# Patient Record
Sex: Female | Born: 1960 | Race: White | Hispanic: No | Marital: Married | Smoking: Former smoker
Health system: Southern US, Community
[De-identification: ages and names within clinical notes are randomized; demographics above are authoritative.]

## PROBLEM LIST (undated history)

## (undated) DIAGNOSIS — Z8719 Personal history of other diseases of the digestive system: Secondary | ICD-10-CM

## (undated) DIAGNOSIS — F329 Major depressive disorder, single episode, unspecified: Secondary | ICD-10-CM

## (undated) DIAGNOSIS — F32A Depression, unspecified: Secondary | ICD-10-CM

## (undated) DIAGNOSIS — I10 Essential (primary) hypertension: Secondary | ICD-10-CM

## (undated) DIAGNOSIS — Z8711 Personal history of peptic ulcer disease: Secondary | ICD-10-CM

## (undated) DIAGNOSIS — R197 Diarrhea, unspecified: Secondary | ICD-10-CM

## (undated) DIAGNOSIS — D249 Benign neoplasm of unspecified breast: Secondary | ICD-10-CM

## (undated) DIAGNOSIS — K625 Hemorrhage of anus and rectum: Secondary | ICD-10-CM

## (undated) DIAGNOSIS — E119 Type 2 diabetes mellitus without complications: Secondary | ICD-10-CM

## (undated) HISTORY — DX: Essential (primary) hypertension: I10

## (undated) HISTORY — DX: Personal history of other diseases of the digestive system: Z87.19

## (undated) HISTORY — PX: ESOPHAGOGASTRODUODENOSCOPY: SHX1529

## (undated) HISTORY — PX: ABDOMINAL HYSTERECTOMY: SHX81

## (undated) HISTORY — PX: BREAST SURGERY: SHX581

## (undated) HISTORY — PX: BREAST EXCISIONAL BIOPSY: SUR124

## (undated) HISTORY — PX: AUGMENTATION MAMMAPLASTY: SUR837

## (undated) HISTORY — PX: BREAST ENHANCEMENT SURGERY: SHX7

## (undated) HISTORY — DX: Hemorrhage of anus and rectum: K62.5

## (undated) HISTORY — DX: Diarrhea, unspecified: R19.7

## (undated) HISTORY — DX: Personal history of peptic ulcer disease: Z87.11

---

## 2006-05-26 ENCOUNTER — Emergency Department (HOSPITAL_COMMUNITY): Admission: EM | Admit: 2006-05-26 | Discharge: 2006-05-26 | Payer: Self-pay | Admitting: Emergency Medicine

## 2008-05-25 ENCOUNTER — Encounter: Admission: RE | Admit: 2008-05-25 | Discharge: 2008-05-25 | Payer: Self-pay | Admitting: Obstetrics and Gynecology

## 2008-05-27 ENCOUNTER — Encounter: Admission: RE | Admit: 2008-05-27 | Discharge: 2008-05-27 | Payer: Self-pay | Admitting: Obstetrics and Gynecology

## 2009-12-02 ENCOUNTER — Encounter: Payer: Self-pay | Admitting: Emergency Medicine

## 2009-12-02 ENCOUNTER — Ambulatory Visit: Payer: Self-pay | Admitting: Diagnostic Radiology

## 2010-01-09 ENCOUNTER — Encounter: Admission: RE | Admit: 2010-01-09 | Discharge: 2010-01-09 | Payer: Self-pay | Admitting: Family Medicine

## 2010-07-08 ENCOUNTER — Emergency Department (HOSPITAL_COMMUNITY): Admission: EM | Admit: 2010-07-08 | Discharge: 2010-07-08 | Payer: Self-pay | Admitting: Emergency Medicine

## 2010-07-12 ENCOUNTER — Emergency Department (HOSPITAL_COMMUNITY): Admission: EM | Admit: 2010-07-12 | Discharge: 2010-07-12 | Payer: Self-pay | Admitting: Emergency Medicine

## 2010-10-19 ENCOUNTER — Inpatient Hospital Stay (HOSPITAL_COMMUNITY): Admission: EM | Admit: 2010-10-19 | Discharge: 2009-12-04 | Payer: Self-pay | Admitting: Internal Medicine

## 2011-01-22 ENCOUNTER — Other Ambulatory Visit: Payer: Self-pay | Admitting: Family Medicine

## 2011-01-22 DIAGNOSIS — Z1231 Encounter for screening mammogram for malignant neoplasm of breast: Secondary | ICD-10-CM

## 2011-01-26 LAB — POCT I-STAT, CHEM 8
BUN: 5 mg/dL — ABNORMAL LOW (ref 6–23)
Calcium, Ion: 1.03 mmol/L — ABNORMAL LOW (ref 1.12–1.32)
Chloride: 105 mEq/L (ref 96–112)
Creatinine, Ser: 0.8 mg/dL (ref 0.4–1.2)
Glucose, Bld: 90 mg/dL (ref 70–99)
HCT: 46 % (ref 36.0–46.0)
Hemoglobin: 15.6 g/dL — ABNORMAL HIGH (ref 12.0–15.0)
Potassium: 4 mEq/L (ref 3.5–5.1)
Sodium: 138 mEq/L (ref 135–145)
TCO2: 23 mmol/L (ref 0–100)

## 2011-01-26 LAB — DIFFERENTIAL
Basophils Absolute: 0 10*3/uL (ref 0.0–0.1)
Basophils Relative: 0 % (ref 0–1)
Eosinophils Absolute: 0.2 10*3/uL (ref 0.0–0.7)
Eosinophils Relative: 4 % (ref 0–5)
Lymphocytes Relative: 43 % (ref 12–46)
Lymphs Abs: 2 10*3/uL (ref 0.7–4.0)
Monocytes Absolute: 0.4 10*3/uL (ref 0.1–1.0)
Monocytes Relative: 10 % (ref 3–12)
Neutro Abs: 2 10*3/uL (ref 1.7–7.7)
Neutrophils Relative %: 43 % (ref 43–77)

## 2011-01-26 LAB — CBC
HCT: 42.1 % (ref 36.0–46.0)
Hemoglobin: 15.2 g/dL — ABNORMAL HIGH (ref 12.0–15.0)
MCH: 34.3 pg — ABNORMAL HIGH (ref 26.0–34.0)
MCHC: 36.1 g/dL — ABNORMAL HIGH (ref 30.0–36.0)
MCV: 95 fL (ref 78.0–100.0)
Platelets: 164 10*3/uL (ref 150–400)
RBC: 4.43 MIL/uL (ref 3.87–5.11)
RDW: 12.1 % (ref 11.5–15.5)
WBC: 4.6 10*3/uL (ref 4.0–10.5)

## 2011-01-26 LAB — POCT CARDIAC MARKERS
CKMB, poc: <1 ng/mL — ABNORMAL LOW (ref 1.0–8.0)
Myoglobin, poc: 38.8 ng/mL (ref 12–200)
Troponin i, poc: <0.05 ng/mL (ref 0.00–0.09)

## 2011-01-29 ENCOUNTER — Ambulatory Visit
Admission: RE | Admit: 2011-01-29 | Discharge: 2011-01-29 | Disposition: A | Discharge status: Home / Self Care | Payer: Managed Care, Other (non HMO) | Source: Ambulatory Visit | Attending: Family Medicine | Admitting: Family Medicine

## 2011-01-29 DIAGNOSIS — Z1231 Encounter for screening mammogram for malignant neoplasm of breast: Secondary | ICD-10-CM

## 2011-01-29 LAB — COMPREHENSIVE METABOLIC PANEL
ALT: 18 U/L (ref 0–35)
ALT: 21 U/L (ref 0–35)
AST: 21 U/L (ref 0–37)
AST: 36 U/L (ref 0–37)
Albumin: 3.5 g/dL (ref 3.5–5.2)
Albumin: 4.4 g/dL (ref 3.5–5.2)
Alkaline Phosphatase: 69 U/L (ref 39–117)
Alkaline Phosphatase: 70 U/L (ref 39–117)
BUN: 12 mg/dL (ref 6–23)
BUN: 21 mg/dL (ref 6–23)
CO2: 25 mEq/L (ref 19–32)
CO2: 27 mEq/L (ref 19–32)
Calcium: 9.1 mg/dL (ref 8.4–10.5)
Calcium: 9.3 mg/dL (ref 8.4–10.5)
Chloride: 103 mEq/L (ref 96–112)
Chloride: 106 mEq/L (ref 96–112)
Creatinine, Ser: 0.74 mg/dL (ref 0.4–1.2)
Creatinine, Ser: 0.8 mg/dL (ref 0.4–1.2)
GFR calc Af Amer: >60 mL/min (ref >60)
GFR calc Af Amer: >60 mL/min (ref >60)
GFR calc non Af Amer: >60 mL/min (ref >60)
GFR calc non Af Amer: >60 mL/min (ref >60)
Glucose, Bld: 167 mg/dL — ABNORMAL HIGH (ref 70–99)
Glucose, Bld: 94 mg/dL (ref 70–99)
Potassium: 3.6 mEq/L (ref 3.5–5.1)
Potassium: 4.6 mEq/L (ref 3.5–5.1)
Sodium: 137 mEq/L (ref 135–145)
Sodium: 139 mEq/L (ref 135–145)
Total Bilirubin: 0.6 mg/dL (ref 0.3–1.2)
Total Bilirubin: 0.9 mg/dL (ref 0.3–1.2)
Total Protein: 6.2 g/dL (ref 6.0–8.3)
Total Protein: 7.3 g/dL (ref 6.0–8.3)

## 2011-01-29 LAB — PROTIME-INR
INR: 0.92 (ref 0.00–1.49)
Prothrombin Time: 12.3 seconds (ref 11.6–15.2)

## 2011-01-29 LAB — APTT: aPTT: 28 seconds (ref 24–37)

## 2011-01-29 LAB — CBC
HCT: 39.3 % (ref 36.0–46.0)
HCT: 42.2 % (ref 36.0–46.0)
Hemoglobin: 14.1 g/dL (ref 12.0–15.0)
Hemoglobin: 14.8 g/dL (ref 12.0–15.0)
MCHC: 35 g/dL (ref 30.0–36.0)
MCHC: 35.9 g/dL (ref 30.0–36.0)
MCV: 96 fL (ref 78.0–100.0)
MCV: 96.6 fL (ref 78.0–100.0)
Platelets: 136 10*3/uL — ABNORMAL LOW (ref 150–400)
Platelets: 176 10*3/uL (ref 150–400)
RBC: 4.07 MIL/uL (ref 3.87–5.11)
RBC: 4.4 MIL/uL (ref 3.87–5.11)
RDW: 11.3 % — ABNORMAL LOW (ref 11.5–15.5)
RDW: 12.1 % (ref 11.5–15.5)
WBC: 5.4 10*3/uL (ref 4.0–10.5)
WBC: 5.8 10*3/uL (ref 4.0–10.5)

## 2011-01-29 LAB — LIPID PANEL
Cholesterol: 204 mg/dL — ABNORMAL HIGH (ref 0–200)
HDL: 66 mg/dL (ref >39)
LDL Cholesterol: 109 mg/dL — ABNORMAL HIGH (ref 0–99)
Total CHOL/HDL Ratio: 3.1 RATIO
Triglycerides: 147 mg/dL (ref <150)
VLDL: 29 mg/dL (ref 0–40)

## 2011-01-29 LAB — BASIC METABOLIC PANEL
BUN: 14 mg/dL (ref 6–23)
CO2: 31 mEq/L (ref 19–32)
Calcium: 9.9 mg/dL (ref 8.4–10.5)
Chloride: 102 mEq/L (ref 96–112)
Creatinine, Ser: 0.85 mg/dL (ref 0.4–1.2)
GFR calc Af Amer: >60 mL/min (ref >60)
GFR calc non Af Amer: >60 mL/min (ref >60)
Glucose, Bld: 93 mg/dL (ref 70–99)
Potassium: 4.5 mEq/L (ref 3.5–5.1)
Sodium: 140 mEq/L (ref 135–145)

## 2011-01-29 LAB — DIFFERENTIAL
Basophils Absolute: 0.1 10*3/uL (ref 0.0–0.1)
Basophils Relative: 2 % — ABNORMAL HIGH (ref 0–1)
Eosinophils Absolute: 0.1 10*3/uL (ref 0.0–0.7)
Eosinophils Relative: 2 % (ref 0–5)
Lymphocytes Relative: 38 % (ref 12–46)
Lymphs Abs: 2.2 10*3/uL (ref 0.7–4.0)
Monocytes Absolute: 0.6 10*3/uL (ref 0.1–1.0)
Monocytes Relative: 10 % (ref 3–12)
Neutro Abs: 2.8 10*3/uL (ref 1.7–7.7)
Neutrophils Relative %: 48 % (ref 43–77)

## 2011-01-29 LAB — CARDIAC PANEL(CRET KIN+CKTOT+MB+TROPI)
CK, MB: 1.1 ng/mL (ref 0.3–4.0)
CK, MB: 1.1 ng/mL (ref 0.3–4.0)
CK, MB: 1.1 ng/mL (ref 0.3–4.0)
Relative Index: INVALID (ref 0.0–2.5)
Relative Index: INVALID (ref 0.0–2.5)
Relative Index: INVALID (ref 0.0–2.5)
Total CK: 67 U/L (ref 7–177)
Total CK: 72 U/L (ref 7–177)
Total CK: 90 U/L (ref 7–177)
Troponin I: 0.01 ng/mL (ref 0.00–0.06)
Troponin I: 0.02 ng/mL (ref 0.00–0.06)
Troponin I: 0.04 ng/mL (ref 0.00–0.06)

## 2011-01-29 LAB — POCT CARDIAC MARKERS
CKMB, poc: <1 ng/mL — ABNORMAL LOW (ref 1.0–8.0)
Myoglobin, poc: 35.7 ng/mL (ref 12–200)
Troponin i, poc: <0.05 ng/mL (ref 0.00–0.09)

## 2011-01-29 LAB — TSH: TSH: 4.195 u[IU]/mL (ref 0.350–4.500)

## 2011-01-29 LAB — HEMOGLOBIN A1C
Hgb A1c MFr Bld: 5.8 % (ref 4.6–6.1)
Mean Plasma Glucose: 120 mg/dL

## 2012-01-07 ENCOUNTER — Other Ambulatory Visit: Payer: Self-pay | Admitting: Family Medicine

## 2012-01-07 DIAGNOSIS — Z1231 Encounter for screening mammogram for malignant neoplasm of breast: Secondary | ICD-10-CM

## 2012-01-30 ENCOUNTER — Ambulatory Visit
Admission: RE | Admit: 2012-01-30 | Discharge: 2012-01-30 | Disposition: A | Discharge status: Home / Self Care | Payer: Managed Care, Other (non HMO) | Source: Ambulatory Visit | Attending: Family Medicine | Admitting: Family Medicine

## 2012-01-30 DIAGNOSIS — Z1231 Encounter for screening mammogram for malignant neoplasm of breast: Secondary | ICD-10-CM

## 2012-12-13 HISTORY — PX: COLONOSCOPY: SHX174

## 2012-12-24 ENCOUNTER — Other Ambulatory Visit: Payer: Self-pay | Admitting: Family Medicine

## 2012-12-24 DIAGNOSIS — Z1231 Encounter for screening mammogram for malignant neoplasm of breast: Secondary | ICD-10-CM

## 2013-09-13 ENCOUNTER — Encounter (HOSPITAL_COMMUNITY): Payer: Self-pay | Admitting: Emergency Medicine

## 2013-09-13 ENCOUNTER — Emergency Department (HOSPITAL_COMMUNITY): Payer: Managed Care, Other (non HMO)

## 2013-09-13 ENCOUNTER — Emergency Department (HOSPITAL_COMMUNITY)
Admission: EM | Admit: 2013-09-13 | Discharge: 2013-09-13 | Disposition: A | Discharge status: Home / Self Care | Payer: Managed Care, Other (non HMO) | Attending: Emergency Medicine | Admitting: Emergency Medicine

## 2013-09-13 DIAGNOSIS — I10 Essential (primary) hypertension: Secondary | ICD-10-CM | POA: Insufficient documentation

## 2013-09-13 DIAGNOSIS — R002 Palpitations: Secondary | ICD-10-CM

## 2013-09-13 DIAGNOSIS — R61 Generalized hyperhidrosis: Secondary | ICD-10-CM | POA: Insufficient documentation

## 2013-09-13 DIAGNOSIS — M6281 Muscle weakness (generalized): Secondary | ICD-10-CM | POA: Insufficient documentation

## 2013-09-13 DIAGNOSIS — F17200 Nicotine dependence, unspecified, uncomplicated: Secondary | ICD-10-CM | POA: Insufficient documentation

## 2013-09-13 DIAGNOSIS — Z888 Allergy status to other drugs, medicaments and biological substances status: Secondary | ICD-10-CM | POA: Insufficient documentation

## 2013-09-13 DIAGNOSIS — Z79899 Other long term (current) drug therapy: Secondary | ICD-10-CM | POA: Insufficient documentation

## 2013-09-13 LAB — CBC
HCT: 44.1 % (ref 36.0–46.0)
Hemoglobin: 16.1 g/dL — ABNORMAL HIGH (ref 12.0–15.0)
MCH: 35.4 pg — ABNORMAL HIGH (ref 26.0–34.0)
MCHC: 36.5 g/dL — ABNORMAL HIGH (ref 30.0–36.0)
MCV: 96.9 fL (ref 78.0–100.0)
Platelets: 197 10*3/uL (ref 150–400)
RBC: 4.55 MIL/uL (ref 3.87–5.11)
RDW: 11.8 % (ref 11.5–15.5)
WBC: 4.7 10*3/uL (ref 4.0–10.5)

## 2013-09-13 LAB — BASIC METABOLIC PANEL
BUN: 13 mg/dL (ref 6–23)
CO2: 24 mEq/L (ref 19–32)
Calcium: 9.8 mg/dL (ref 8.4–10.5)
Chloride: 99 mEq/L (ref 96–112)
Creatinine, Ser: 0.72 mg/dL (ref 0.50–1.10)
GFR calc Af Amer: >90 mL/min (ref >90)
GFR calc non Af Amer: >90 mL/min (ref >90)
Glucose, Bld: 95 mg/dL (ref 70–99)
Potassium: 3.4 mEq/L — ABNORMAL LOW (ref 3.5–5.1)
Sodium: 137 mEq/L (ref 135–145)

## 2013-09-13 LAB — POCT I-STAT TROPONIN I: Troponin i, poc: 0 ng/mL (ref 0.00–0.08)

## 2013-09-13 MED ORDER — HYDROCHLOROTHIAZIDE 25 MG PO TABS: 25.0000 mg | ORAL_TABLET | Freq: Every day | ORAL | Status: DC

## 2013-09-13 MED ORDER — PANTOPRAZOLE SODIUM 40 MG PO TBEC
40.0000 mg | DELAYED_RELEASE_TABLET | Freq: Every day | ORAL | Status: DC
  Filled 2013-09-13: qty 1

## 2013-09-13 MED ORDER — LOSARTAN POTASSIUM-HCTZ 100-25 MG PO TABS: 1.0000 | ORAL_TABLET | Freq: Every day | ORAL | Status: DC

## 2013-09-13 MED ORDER — ASPIRIN 81 MG PO CHEW
324.0000 mg | CHEWABLE_TABLET | Freq: Once | ORAL | Status: DC
Start: 2013-09-13 — End: 2013-09-13

## 2013-09-13 MED ORDER — LOSARTAN POTASSIUM 50 MG PO TABS
100.0000 mg | ORAL_TABLET | Freq: Every day | ORAL | Status: DC
  Filled 2013-09-13: qty 2

## 2013-09-13 NOTE — ED Notes (Signed)
Pt returned from xray and states she was ready to go home. PA informed. Pt refused all ordered medications.

## 2013-09-13 NOTE — ED Provider Notes (Signed)
CSN: [id number]     Arrival date & time 09/13/13  7829 History   First MD Initiated Contact with Patient 09/13/13 [phone]     Chief Complaint  Patient presents with  . Chest Pain   (Consider location/radiation/quality/duration/timing/severity/associated sxs/prior Treatment) HPI  Laurie Larson is a 52 y.o. female with past medical history significant for hypertension and tobacco use disorder complaining of palpitation onset at 4:30 AM today. Heart rate was taken by her husband who is an EMT and was found to be around 200. He also took her blood pressure which was elevated in the 140s over 120s. Palpitations lasted for approximately 2 hours and there was an associated dull ache in the mid chest that lasted for approximately 5 minutes. Patient denies any shortness of breath and nausea but endorses a generalized weakness and diaphoresis. Patient had a similar episode approximately 3 weeks ago work. Patient denies any cocaine, methamphetamine, caffeine or energy drinks use. Patient also denies cough, fever, history of early cardiac death, recent immobilizations, calf pain or lower extremity swelling.  History reviewed. No pertinent past medical history. History reviewed. No pertinent past surgical history. Family History  Problem Relation Age of Onset  . Cancer Mother    History  Substance Use Topics  . Smoking status: Current Every Day Smoker  . Smokeless tobacco: Never Used  . Alcohol Use: Yes   OB History   Grav Para Term Preterm Abortions TAB SAB Ect Mult Living                 Review of Systems 10 systems reviewed and found to be negative, except as noted in the HPI  Allergies  Acetaminophen  Home Medications   Current Outpatient Rx  Name  Route  Sig  Dispense  Refill  . losartan-hydrochlorothiazide (HYZAAR) 100-25 MG per tablet   Oral   Take 1 tablet by mouth daily.         . Multiple Vitamin (MULTIVITAMIN WITH MINERALS) TABS tablet   Oral   Take 1 tablet by mouth  daily.         Marland Kitchen omeprazole (PRILOSEC) 20 MG capsule   Oral   Take 20 mg by mouth daily.          BP 176/107  Pulse 82  Temp(Src) 98.6 F (37 C) (Oral)  Resp 13  SpO2 99% Physical Exam  Nursing note and vitals reviewed. Constitutional: She is oriented to person, place, and time. She appears well-developed and well-nourished. No distress.  HENT:  Head: Normocephalic.  Eyes: Conjunctivae and EOM are normal. Pupils are equal, round, and reactive to light.  Neck: Normal range of motion. No JVD present.  Cardiovascular: Normal rate, regular rhythm and intact distal pulses.   Pulmonary/Chest: Effort normal and breath sounds normal. No stridor. No respiratory distress. She has no wheezes. She has no rales. She exhibits no tenderness.  Abdominal: Soft. Bowel sounds are normal. She exhibits no distension and no mass. There is no tenderness. There is no rebound and no guarding.  Musculoskeletal: Normal range of motion.  No calf asymmetry, superficial collaterals, palpable cords, edema, Homans sign negative bilaterally.    Neurological: She is alert and oriented to person, place, and time.  Follows commands, Goal oriented speech, Strength is 5 out of 5x4 extremities, patient ambulates with a coordinated in nonantalgic gait. Sensation is grossly intact.   Psychiatric: She has a normal mood and affect.    ED Course  Procedures (including critical care time) Labs Review Labs  Reviewed  CBC - Abnormal; Notable for the following:    Hemoglobin 16.1 (*)    MCH 35.4 (*)    MCHC 36.5 (*)    All other components within normal limits  BASIC METABOLIC PANEL - Abnormal; Notable for the following:    Potassium 3.4 (*)    All other components within normal limits  POCT I-STAT TROPONIN I   Imaging Review Dg Chest 2 View  09/13/2013   CLINICAL DATA:  Chest pain  EXAM: CHEST  2 VIEW  COMPARISON:  07/12/2010  FINDINGS: The heart size and mediastinal contours are within normal limits. Both lungs  are clear of edema or consolidation. No effusion or pneumothorax. The visualized skeletal structures are unremarkable. Bilateral breast implant.  IMPRESSION: No evidence of active cardiopulmonary disease.   Electronically Signed   By: Tiburcio Pea M.D.   On: 09/13/2013 06:43    EKG Interpretation   None      Date: 09/13/2013  Rate: 92  Rhythm: normal sinus rhythm  QRS Axis: normal  Intervals: normal  ST/T Wave abnormalities: normal  Conduction Disutrbances:none  Narrative Interpretation: P-mitrale  Old EKG Reviewed: unchanged  7:00 AM patient would like to go home and would not like to stay for a delta troponin. I have tried to convince her and her husband to stay but they have opted to leave. I have advised her to call 911 if the palpitations or chest pain returns. I have advised her to follow with her primary care and cardiology as soon as possible to discuss Holter monitoring. Smoking cessation and decreased sodium intake encourage. I have also advised her to start a daily low dose aspirin to prevent complications for possible paroxysmal atrial fibrillation.   MDM   1. Palpitations      Filed Vitals:   09/13/13 0527 09/13/13 0600 09/13/13 0615  BP: 158/121 156/109 176/107  Pulse: 90 81 82  Temp: 98.6 F (37 C)    TempSrc: Oral    Resp: 16 14 13   SpO2: 97% 97% 99%     Trameka Larson is a 52 y.o. female with 2 hours of palpitations this a.m. EKG shows a normal sinus rhythm, patient has hypertension moderately well-controlled with Hyzaar. EKG does show a P mitrale. No LVH. This is likely a proximal A. fib from long-term hypertension and left atrial enlargement. I have asked the patient to stay for cardiac monitoring anda delta troponin. We have discussed the importance of following up for Holter monitoring. Patient has declined to stay after initial evaluation. She seems reliable for followup, her husband is an EMT. I have advised her that she should call 911 immediately  if the palpitations return. Counseled on smoking cessation, sodium reduction. The pastor to start a daily aspirin.  Medications  pantoprazole (PROTONIX) EC tablet 40 mg (40 mg Oral Not Given 09/13/13 0652)  losartan (COZAAR) tablet 100 mg (100 mg Oral Not Given 09/13/13 0652)  hydrochlorothiazide (HYDRODIURIL) tablet 25 mg (25 mg Oral Not Given 09/13/13 0653)    Pt is hemodynamically stable, appropriate for, and amenable to discharge at this time. Pt verbalized understanding and agrees with care plan. All questions answered. Outpatient follow-up and specific return precautions discussed.    Note: Portions of this report may have been transcribed using voice recognition software. Every effort was made to ensure accuracy; however, inadvertent computerized transcription errors may be present     Wynetta Emery, PA-C 09/13/13 0703

## 2013-09-13 NOTE — ED Notes (Signed)
Pt reports that she woke up at 0400 this morning and felt like her heart was racing. Pt states she felt SOB, was sweaty, and felt weak. Pt states the pain decreased while she was en route to the department. Pt states her husband tried to take her HR at home but it was too fast to count. Pt reports she has not taken her BP medication today yet, pt states she has not missed a dose of her BP medication. Pt reports she has not had HTN in awhile like this (152/112). Pt denies a HA, N/V, or chest pain at this time. PT states she took 324 mg of aspirin before coming to the department.

## 2013-09-13 NOTE — ED Notes (Signed)
Pt states she woke up tonight with heart racing, high blood pressure, and left side chest tightness, with weakness, diaphoresis. Pain exacerbated by nothing and relived by nothing. Denies nausea.

## 2013-09-13 NOTE — ED Notes (Signed)
Patient transported to X-ray 

## 2013-09-13 NOTE — ED Notes (Signed)
Orders discontinued as pt already had 324 mg of aspirin prior to arrival to department.

## 2013-09-13 NOTE — ED Provider Notes (Signed)
Medical screening examination/treatment/procedure(s) were performed by non-physician practitioner and as supervising physician I was immediately available for consultation/collaboration.  EKG: nsr, no acute ischemic changes, normal intervals, normal axis, normal qrs complex   Brandt Loosen, MD 09/13/13 [phone]

## 2014-04-23 ENCOUNTER — Other Ambulatory Visit: Payer: Self-pay

## 2014-04-23 DIAGNOSIS — Z1231 Encounter for screening mammogram for malignant neoplasm of breast: Secondary | ICD-10-CM

## 2014-06-02 ENCOUNTER — Inpatient Hospital Stay: Admission: RE | Admit: 2014-06-02 | Payer: Managed Care, Other (non HMO) | Source: Ambulatory Visit

## 2014-06-09 ENCOUNTER — Encounter (INDEPENDENT_AMBULATORY_CARE_PROVIDER_SITE_OTHER): Payer: Self-pay

## 2014-06-09 ENCOUNTER — Ambulatory Visit
Admission: RE | Admit: 2014-06-09 | Discharge: 2014-06-09 | Disposition: A | Discharge status: Home / Self Care | Payer: Managed Care, Other (non HMO) | Source: Ambulatory Visit

## 2014-06-09 DIAGNOSIS — Z1231 Encounter for screening mammogram for malignant neoplasm of breast: Secondary | ICD-10-CM

## 2014-09-08 ENCOUNTER — Other Ambulatory Visit: Payer: Self-pay | Admitting: Gastroenterology

## 2014-09-08 DIAGNOSIS — R197 Diarrhea, unspecified: Secondary | ICD-10-CM

## 2014-09-10 ENCOUNTER — Other Ambulatory Visit: Payer: Managed Care, Other (non HMO)

## 2014-09-15 ENCOUNTER — Ambulatory Visit
Admission: RE | Admit: 2014-09-15 | Discharge: 2014-09-15 | Disposition: A | Discharge status: Home / Self Care | Payer: Managed Care, Other (non HMO) | Source: Ambulatory Visit | Attending: Gastroenterology | Admitting: Gastroenterology

## 2014-09-15 DIAGNOSIS — R197 Diarrhea, unspecified: Secondary | ICD-10-CM

## 2014-09-15 MED ORDER — IOHEXOL 300 MG/ML  SOLN
100.0000 mL | Freq: Once | INTRAMUSCULAR | Status: AC | PRN
  Administered 2014-09-15: 100 mL via INTRAVENOUS

## 2015-01-14 ENCOUNTER — Ambulatory Visit: Payer: Managed Care, Other (non HMO) | Admitting: Internal Medicine

## 2015-01-31 ENCOUNTER — Encounter: Payer: Self-pay | Admitting: Internal Medicine

## 2015-01-31 ENCOUNTER — Ambulatory Visit (INDEPENDENT_AMBULATORY_CARE_PROVIDER_SITE_OTHER): Payer: Managed Care, Other (non HMO) | Admitting: Internal Medicine

## 2015-01-31 VITALS — BP 140/82 | HR 102 | Temp 98.3°F | Ht 64.0 in | Wt 141.8 lb

## 2015-01-31 DIAGNOSIS — E278 Other specified disorders of adrenal gland: Secondary | ICD-10-CM | POA: Diagnosis not present

## 2015-01-31 LAB — POTASSIUM: Potassium: 3.7 mEq/L (ref 3.5–5.1)

## 2015-01-31 MED ORDER — DEXAMETHASONE 1 MG PO TABS: ORAL_TABLET | ORAL | Status: DC

## 2015-01-31 NOTE — Progress Notes (Signed)
Patient ID: Laurie Larson, female   DOB: 1961-09-29, 54 y.o.   MRN: [id number]   HPI  Laurie Larson is a 54 y.o.-year-old female, referred by her PCP, Dr. 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  I reviewed pt's previous CT scans:  07/12/2010: CT angio: A 1.2 x 2.3 cm low attenuation nodule in the left adrenal gland measures 12 HU, but is incompletely imaged. Attenuation characteristics consistent with adenoma  09/15/2014: CT abd: The right adrenal gland appears normal. Nodule in the left adrenal gland measures 1.5 x 2.8 cm. This is unchanged from 07/12/2010 and is compatible with a benign adrenal adenoma.  Pt denies spells of high BP (she does have a dx of HTN), HA and palpitations. No pallor.   She does not have diabetes or prediabetes. No increased weight. No moon facies/buffalo hump. No purple, wide striae.  No h/o hyperkalemia - in 2014, she had a slightly low K, at 3.4 x1.  She c/o low libido.   She has a h/o stress at job >> had palpitations >> now changed her position at work >> not having palpitations anymore.  She also has sciatica pain.  I reviewed her chart and she also has a history of TAH + BSO - 2002, has not been on HRT.  ROS: Constitutional: no weight gain/loss, no fatigue, no subjective hyperthermia/hypothermia Eyes: no blurry vision, no xerophthalmia ENT: no sore throat, no nodules palpated in throat, no dysphagia/odynophagia, no hoarseness Cardiovascular: no CP/SOB/palpitations/leg swelling Respiratory: no cough/SOB Gastrointestinal: no N/V/D/C Musculoskeletal: no muscle/joint aches Skin: no rashes Neurological: no tremors/numbness/tingling/dizziness, + sciatic L side Psychiatric: no depression/anxiety + low libido.  Past Medical History  Diagnosis Date  . Hypertension   . History of stomach ulcers   . Benign hypertension   . Diarrhea     GI Outlaw  . Rectal bleeding   . Depression    Past Surgical History   Procedure Laterality Date  . Abdominal hysterectomy    . Breast surgery      benign right breast  . Breast enhancement surgery    . Esophagogastroduodenoscopy      normal esophagus,gastritis, duodenal erosions without bleeding, bx benign  . Colonoscopy  12/2012    polyps benign, diverticulosis, hemorrhoids, recheck in 3 years   History   Social History  . Marital Status: Married    Spouse Name: N/A  . Number of Children: 0   Occupational History  . Customer s[employer]   Social History Main Topics  . Smoking status: Current Every Day Smoker  . Smokeless tobacco: Never Used  . Alcohol Use: Yes  . Drug Use: No  . Sexual Activity: Yes   Current Outpatient Prescriptions on File Prior to Visit  Medication Sig Dispense Refill  . losartan-hydrochlorothiazide (HYZAAR) 100-25 MG per tablet Take 1 tablet by mouth daily.    . Multiple Vitamin (MULTIVITAMIN WITH MINERALS) TABS tablet Take 1 tablet by mouth daily.    Marland Kitchen omeprazole (PRILOSEC) 20 MG capsule Take 20 mg by mouth daily.     No current facility-administered medications on file prior to visit.   Allergies  Allergen Reactions  . Acetaminophen Anaphylaxis  . Lisinopril Hives and Swelling  . Norvasc [Amlodipine Besylate] Swelling   Family History  Problem Relation Age of Onset  . Ovarian cancer Maternal Aunt     deceased  . Cancer Maternal Grandmother     type unknown  . Breast cancer Sister   . Breast cancer Mother  deceased   PE: BP 140/82 mmHg  Pulse 102  Temp(Src) 98.3 F (36.8 C) (Oral)  Ht 5\' 4"  (1.626 m)  Wt 141 lb 12.8 oz (64.32 kg)  BMI 24.33 kg/m2 Wt Readings from Last 3 Encounters:  01/31/15 141 lb 12.8 oz (64.32 kg)   Constitutional: normal weight, in NAD Eyes: PERRLA, EOMI, no exophthalmos, no lid lag, no stare ENT: moist mucous membranes, no thyromegaly, no cervical lymphadenopathy Cardiovascular: RRR, No MRG Respiratory: CTA B Gastrointestinal: abdomen soft, NT, ND, BS+ Musculoskeletal: no  deformities, strength intact in all 4 Skin: moist, warm, no rashes Neurological: no tremor with outstretched hands, DTR normal in all 4  ASSESSMENT: 1. Thyrotoxicosis  PLAN:  1. Patient with a L adrenal nodule discovered incidentally. I reviewed the images of the L adenoma on her latest CT scan with the pt. - We discussed about the fact that there are 3 possible scenarios: - A nonfunctioning adrenal nodule - A functioning adrenal adenoma - which can hypersecrete catecholamines/metanephrines, cortisol, or aldosterone - Adrenal cancer/metastasis We do not have blood tests to check for cancer, but the best indicator is lack of change in size and appearance over time.  - To differentiate between a functioning and a nonfunctioning adrenal nodule, we'll need to rule out hypersecretion by checking the following tests  - dexamethasone suppression test to rule out Cushing syndrome (6% of adrenal incidentalomas). If the cortisol level returns >5, will need 24h urine free cortisol - Plasma fractionated metanephrines and catecholamines to rule out pheochromocytoma (3% of adrenal incidentalomas) - Plasma renin activity and aldosterone level to rule out primary hyperaldosteronism (0.6% of adrenal incidentalomas) - I ordered the above tests today. I advised pt to take the dexamethasone tablets (1 mg total dose, sent to pharmacy) at 11 PM the night before coming to the lab to have a cortisol level drawn. I also added dexamethasone level. - We discussed about the need for an other CT scan, and I believe that we can wait for another year or 2 and then have her back for repeat hormonal testing and ordering a dedicated adrenal CT. I will need to order it with and without contrast, if the Hounsfield units are low and the lesion again appears benign, we might not need to get the contrasted CT for washout.  - we discussed about f/u (per guidelines):   hormonal testing yearly for 5 years   CT scans yearly x1-2 - I  explained all the above to the patient, and she agrees with the plan. - I will see her back in a year  Orders Placed This Encounter  Procedures  . Aldosterone + renin activity w/ ratio  . Potassium  . Metanephrines, plasma  . Catecholamines, Fractionated, Plasma  . Cortisol  . Dexamethasone, blood   - time spent with the patient: 1 hour, of which >50% was spent in obtaining information about her symptoms, reviewing her previous labs and evaluations, counseling her about her adrenal mass (please see the discussed topics above), and developing a plan to further investigate it. She had a number of questions which I addressed.  Component     Latest Ref Rng 01/31/2015  Epinephrine      32  Norepinephrine      1043  Dopamine      69  Catecholamines, Total      1075  PRA LC/MS/MS     0.25 - 5.82 ng/mL/h 21.70 (H)  ALDO / PRA Ratio     0.9 - 28.9  Ratio 0.7 (L)  ALDOSTERONE      16  Metanephrine, Free     <=57 pg/mL <25  Normetanephrine, Free     <=148 pg/mL 218 (H)  Total Metanephrines-Plasma     <=205 pg/mL 218 (H)  Potassium     3.5 - 5.1 mEq/L 3.7   Cortisol pending. Low aldosterone. Plasma metanephrines minimally elevated. Low suspicion for pheochromocytoma.  I would like to check a 24h urine for metanephrines. Will let pt know.  I will addend the results when they become available.

## 2015-01-31 NOTE — Patient Instructions (Addendum)
Please stop at the lab.  Please take the Dexamethasone 1 mg at 11 pm the night before coming for labs at 8 am.  Please try to join MyChart for easier communication.  Please return in 1 year.

## 2015-02-03 LAB — METANEPHRINES, PLASMA
Metanephrine, Free: <25 pg/mL (ref <=57)
Normetanephrine, Free: 218 pg/mL — ABNORMAL HIGH (ref <=148)
Total Metanephrines-Plasma: 218 pg/mL — ABNORMAL HIGH (ref <=205)

## 2015-02-04 LAB — CATECHOLAMINES, FRACTIONATED, PLASMA
Catecholamines, Total: 1075 pg/mL
Dopamine: 69 pg/mL
Epinephrine: 32 pg/mL
Norepinephrine: 1043 pg/mL

## 2015-02-15 LAB — ALDOSTERONE + RENIN ACTIVITY W/ RATIO
ALDO / PRA Ratio: 0.7 Ratio — ABNORMAL LOW (ref 0.9–28.9)
Aldosterone: 16 ng/dL
PRA LC/MS/MS: 21.7 ng/mL/h — ABNORMAL HIGH (ref 0.25–5.82)

## 2015-02-22 ENCOUNTER — Encounter: Payer: Self-pay | Admitting: *Deleted

## 2015-07-29 ENCOUNTER — Other Ambulatory Visit: Payer: Self-pay

## 2015-07-29 DIAGNOSIS — Z1231 Encounter for screening mammogram for malignant neoplasm of breast: Secondary | ICD-10-CM

## 2015-08-22 ENCOUNTER — Ambulatory Visit
Admission: RE | Admit: 2015-08-22 | Discharge: 2015-08-22 | Disposition: A | Discharge status: Home / Self Care | Payer: Managed Care, Other (non HMO) | Source: Ambulatory Visit

## 2015-08-22 DIAGNOSIS — Z1231 Encounter for screening mammogram for malignant neoplasm of breast: Secondary | ICD-10-CM

## 2015-08-24 ENCOUNTER — Other Ambulatory Visit: Payer: Self-pay | Admitting: Family Medicine

## 2015-08-24 DIAGNOSIS — R928 Other abnormal and inconclusive findings on diagnostic imaging of breast: Secondary | ICD-10-CM

## 2015-08-31 ENCOUNTER — Ambulatory Visit
Admission: RE | Admit: 2015-08-31 | Discharge: 2015-08-31 | Disposition: A | Discharge status: Home / Self Care | Payer: Managed Care, Other (non HMO) | Source: Ambulatory Visit | Attending: Family Medicine | Admitting: Family Medicine

## 2015-08-31 DIAGNOSIS — R928 Other abnormal and inconclusive findings on diagnostic imaging of breast: Secondary | ICD-10-CM

## 2015-09-01 ENCOUNTER — Other Ambulatory Visit: Payer: Managed Care, Other (non HMO)

## 2016-12-26 ENCOUNTER — Emergency Department (HOSPITAL_COMMUNITY): Payer: Commercial Managed Care - PPO

## 2016-12-26 ENCOUNTER — Emergency Department (HOSPITAL_COMMUNITY)
Admission: EM | Admit: 2016-12-26 | Discharge: 2016-12-26 | Disposition: A | Discharge status: Home Health | Payer: Commercial Managed Care - PPO | Attending: Emergency Medicine | Admitting: Emergency Medicine

## 2016-12-26 ENCOUNTER — Encounter (HOSPITAL_COMMUNITY): Payer: Self-pay | Admitting: Emergency Medicine

## 2016-12-26 DIAGNOSIS — F17210 Nicotine dependence, cigarettes, uncomplicated: Secondary | ICD-10-CM | POA: Insufficient documentation

## 2016-12-26 DIAGNOSIS — Z79899 Other long term (current) drug therapy: Secondary | ICD-10-CM | POA: Diagnosis not present

## 2016-12-26 DIAGNOSIS — I10 Essential (primary) hypertension: Secondary | ICD-10-CM | POA: Diagnosis not present

## 2016-12-26 DIAGNOSIS — R0789 Other chest pain: Secondary | ICD-10-CM | POA: Diagnosis present

## 2016-12-26 DIAGNOSIS — R079 Chest pain, unspecified: Secondary | ICD-10-CM

## 2016-12-26 DIAGNOSIS — R251 Tremor, unspecified: Secondary | ICD-10-CM | POA: Insufficient documentation

## 2016-12-26 LAB — BASIC METABOLIC PANEL
Anion gap: 11 (ref 5–15)
BUN: 7 mg/dL (ref 6–20)
CO2: 26 mmol/L (ref 22–32)
Calcium: 10 mg/dL (ref 8.9–10.3)
Chloride: 99 mmol/L — ABNORMAL LOW (ref 101–111)
Creatinine, Ser: 0.69 mg/dL (ref 0.44–1.00)
GFR calc Af Amer: >60 mL/min (ref >60)
GFR calc non Af Amer: >60 mL/min (ref >60)
Glucose, Bld: 119 mg/dL — ABNORMAL HIGH (ref 65–99)
Potassium: 3.7 mmol/L (ref 3.5–5.1)
Sodium: 136 mmol/L (ref 135–145)

## 2016-12-26 LAB — CBC
HCT: 44.6 % (ref 36.0–46.0)
Hemoglobin: 15.6 g/dL — ABNORMAL HIGH (ref 12.0–15.0)
MCH: 33.4 pg (ref 26.0–34.0)
MCHC: 35 g/dL (ref 30.0–36.0)
MCV: 95.5 fL (ref 78.0–100.0)
Platelets: 181 10*3/uL (ref 150–400)
RBC: 4.67 MIL/uL (ref 3.87–5.11)
RDW: 11.5 % (ref 11.5–15.5)
WBC: 7.4 10*3/uL (ref 4.0–10.5)

## 2016-12-26 LAB — TROPONIN I
Troponin I: <0.03 ng/mL (ref <0.03)
Troponin I: <0.03 ng/mL (ref <0.03)

## 2016-12-26 MED ORDER — ASPIRIN 81 MG PO CHEW
324.0000 mg | CHEWABLE_TABLET | Freq: Once | ORAL | Status: AC
  Administered 2016-12-26: 324 mg via ORAL
  Filled 2016-12-26: qty 4

## 2016-12-26 MED ORDER — NITROGLYCERIN 0.4 MG SL SUBL
0.4000 mg | SUBLINGUAL_TABLET | SUBLINGUAL | Status: DC | PRN
  Administered 2016-12-26: 0.4 mg via SUBLINGUAL
  Filled 2016-12-26: qty 1

## 2016-12-26 NOTE — ED Triage Notes (Signed)
Pt c/o sudden onset mid sternal cp x 1 hour. Pt also c/o sob, diaphoresis.

## 2016-12-26 NOTE — ED Provider Notes (Signed)
[facility] DEPT Provider Note   CSN: [id number] Arrival date & time: 12/26/16  0848   By signing my name below, I, Hilbert Odor, attest that this documentation has been prepared under the direction and in the presence of Sherwood Gambler, MD. Electronically Signed: Hilbert Odor, Scribe. 12/26/16. 9:51 AM. History   Chief Complaint Chief Complaint  Patient presents with  . Chest Pain     The history is provided by the patient. No language interpreter was used.  HPI Comments: Laurie Larson is a 56 y.o. female who presents to the Emergency Department complaining of left-sided severe chest pain that began around 7:14 am today. She states that it feels like someone is sitting on her chest. She rates the pain currently as a 5/10. The patient states that she was driving to work this morning when she began to become diaphoretic. She also started to shake at that time. She pulled over on the side of the road and called her husband. She also called the EMS at that time. The EMS advised that she come to the ED for an evaluation. Per husband: she has been in a stressful situation at work recently. He believes that this could be the cause of her pain. The patient states that she has not been sick recently. She denies leg swelling, abdominal pain, cough, nausea, and vomiting. She has hx of HTN which she is on Losartan.. She denies hx of HLD and diabetes. She is a current smoker. She denies any known cardiac problems within the family at a young age.  Past Medical History:  Diagnosis Date  . Benign hypertension   . Depression   . Diarrhea    GI Outlaw  . History of stomach ulcers   . Hypertension   . Rectal bleeding     Patient Active Problem List   Diagnosis Date Noted  . Adrenal incidentaloma ([place]) 01/31/2015    Past Surgical History:  Procedure Laterality Date  . ABDOMINAL HYSTERECTOMY    . BREAST ENHANCEMENT SURGERY    . BREAST SURGERY     benign right breast  .  COLONOSCOPY  12/2012   polyps benign, diverticulosis, hemorrhoids, recheck in 3 years  . ESOPHAGOGASTRODUODENOSCOPY     normal esophagus,gastritis, duodenal erosions without bleeding, bx benign    OB History    No data available       Home Medications    Prior to Admission medications   Medication Sig Start Date End Date Taking? Authorizing Provider  losartan-hydrochlorothiazide (HYZAAR) 100-25 MG per tablet Take 1 tablet by mouth daily.   Yes Historical Provider, MD  Multiple Vitamin (MULTIVITAMIN WITH MINERALS) TABS tablet Take 1 tablet by mouth daily.   Yes Historical Provider, MD    Family History Family History  Problem Relation Age of Onset  . Ovarian cancer Maternal Aunt     deceased  . Cancer Maternal Grandmother     type unknown  . Breast cancer Sister   . Breast cancer Mother     deceased    Social History Social History  Substance Use Topics  . Smoking status: Current Every Day Smoker    Packs/day: 0.25    Types: Cigarettes  . Smokeless tobacco: Never Used  . Alcohol use Yes     Comment: moderate     Allergies   Acetaminophen; Lisinopril; and Norvasc [amlodipine besylate]   Review of Systems Review of Systems  Constitutional: Positive for diaphoresis.  Respiratory: Negative for cough.   Cardiovascular: Positive for chest pain.  Negative for leg swelling.  Gastrointestinal: Negative for abdominal pain, nausea and vomiting.  Neurological: Positive for tremors.  All other systems reviewed and are negative.    Physical Exam Updated Vital Signs BP 132/87   Pulse 67   Temp 98.5 F (36.9 C)   Resp 16   Ht 5\' 5"  (1.651 m)   Wt 130 lb (59 kg)   SpO2 97%   BMI 21.63 kg/m   Physical Exam  Constitutional: She is oriented to person, place, and time. She appears well-developed and well-nourished.  HENT:  Head: Normocephalic and atraumatic.  Right Ear: External ear normal.  Left Ear: External ear normal.  Nose: Nose normal.  Eyes: Right eye  exhibits no discharge. Left eye exhibits no discharge.  Cardiovascular: Normal rate, regular rhythm and normal heart sounds.   Pulmonary/Chest: Effort normal and breath sounds normal. She exhibits no tenderness.  Abdominal: Soft. There is no tenderness.  Neurological: She is alert and oriented to person, place, and time.  Skin: Skin is warm and dry.  Nursing note and vitals reviewed.    ED Treatments / Results  DIAGNOSTIC STUDIES: Oxygen Saturation is 100% on RA, normal by my interpretation.    COORDINATION OF CARE: 9:29 AM Discussed treatment plan with pt at bedside and pt agreed to plan. I will check the patient's troponin levels and EKG.  Labs (all labs ordered are listed, but only abnormal results are displayed) Labs Reviewed  BASIC METABOLIC PANEL - Abnormal; Notable for the following:       Result Value   Chloride 99 (*)    Glucose, Bld 119 (*)    All other components within normal limits  CBC - Abnormal; Notable for the following:    Hemoglobin 15.6 (*)    All other components within normal limits  TROPONIN I  TROPONIN I    EKG  EKG Interpretation  Date/Time:  Wednesday December 26 2016 09:15:02 EST Ventricular Rate:  78 PR Interval:    QRS Duration: 80 QT Interval:  366 QTC Calculation: 417 R Axis:   64 Text Interpretation:  Sinus rhythm no significant change from earlier in the day Confirmed by Idamay Hosein MD, [place] [phone]) on 12/26/2016 10:14:02 AM       Radiology Dg Chest 2 View  Result Date: 12/26/2016 CLINICAL DATA:  Left-sided chest pain and pressure with shortness of Breath EXAM: CHEST  2 VIEW COMPARISON:  09/13/2013 FINDINGS: The heart size and mediastinal contours are within normal limits. Both lungs are clear. The visualized skeletal structures are unremarkable. IMPRESSION: No active cardiopulmonary disease. Electronically Signed   By: Inez Catalina M.D.   On: 12/26/2016 09:52    Procedures Procedures (including critical care time)  Medications  Ordered in ED Medications  nitroGLYCERIN (NITROSTAT) SL tablet 0.4 mg (0.4 mg Sublingual Given 12/26/16 0953)  aspirin chewable tablet 324 mg (324 mg Oral Given 12/26/16 0953)     Initial Impression / Assessment and Plan / ED Course  I have reviewed the triage vital signs and the nursing notes.  Pertinent labs & imaging results that were available during my care of the patient were reviewed by me and considered in my medical decision making (see chart for details).  Clinical Course as of Dec 27 1319  Wed Dec 26, 2016  0931 Will give ASA. Patient declines nitro at this time and has been feeling steady improvement. Possibly stress related although has some risk factors for CAD (smoking, HTN). Will monitor and re-eval. Low suspicion for PE  or dissection  [SG]  1028 Patient feels slightly better but now has a headache after nitro. Will give some fluids. Does not want to be admitted. I think this is likely more stressed induced than ACS/cardiac. Will do second troponin and monitor  [SG]    Clinical Course User Index [SG] Sherwood Gambler, MD    Patient's CP is likely from significant increased stress at work. ECG unremarkable, 2 negative troponins. Discussed options with patient and through shared decision making she wants to go home and f/u with PCP and cardiologist as outpatient. Discussed strict return precautions.   Final Clinical Impressions(s) / ED Diagnoses   Final diagnoses:  Nonspecific chest pain    New Prescriptions New Prescriptions   No medications on file   I personally performed the services described in this documentation, which was scribed in my presence. The recorded information has been reviewed and is accurate.    Sherwood Gambler, MD 12/26/16 1322

## 2017-01-18 ENCOUNTER — Ambulatory Visit: Payer: Commercial Managed Care - PPO | Admitting: Internal Medicine

## 2017-12-01 ENCOUNTER — Emergency Department (HOSPITAL_COMMUNITY): Payer: Commercial Managed Care - PPO

## 2017-12-01 ENCOUNTER — Other Ambulatory Visit: Payer: Self-pay

## 2017-12-01 ENCOUNTER — Observation Stay (HOSPITAL_COMMUNITY)
Admission: EM | Admit: 2017-12-01 | Discharge: 2017-12-02 | Disposition: A | Discharge status: Home / Self Care | Payer: Commercial Managed Care - PPO | Attending: Internal Medicine | Admitting: Internal Medicine

## 2017-12-01 ENCOUNTER — Encounter (HOSPITAL_COMMUNITY): Payer: Self-pay | Admitting: Emergency Medicine

## 2017-12-01 DIAGNOSIS — E1110 Type 2 diabetes mellitus with ketoacidosis without coma: Principal | ICD-10-CM | POA: Diagnosis present

## 2017-12-01 DIAGNOSIS — K219 Gastro-esophageal reflux disease without esophagitis: Secondary | ICD-10-CM | POA: Insufficient documentation

## 2017-12-01 DIAGNOSIS — Z886 Allergy status to analgesic agent status: Secondary | ICD-10-CM | POA: Diagnosis not present

## 2017-12-01 DIAGNOSIS — Z888 Allergy status to other drugs, medicaments and biological substances status: Secondary | ICD-10-CM | POA: Insufficient documentation

## 2017-12-01 DIAGNOSIS — Z8719 Personal history of other diseases of the digestive system: Secondary | ICD-10-CM | POA: Diagnosis not present

## 2017-12-01 DIAGNOSIS — E876 Hypokalemia: Secondary | ICD-10-CM | POA: Diagnosis present

## 2017-12-01 DIAGNOSIS — I10 Essential (primary) hypertension: Secondary | ICD-10-CM | POA: Diagnosis present

## 2017-12-01 DIAGNOSIS — E1310 Other specified diabetes mellitus with ketoacidosis without coma: Secondary | ICD-10-CM

## 2017-12-01 DIAGNOSIS — Z833 Family history of diabetes mellitus: Secondary | ICD-10-CM | POA: Insufficient documentation

## 2017-12-01 DIAGNOSIS — Z79899 Other long term (current) drug therapy: Secondary | ICD-10-CM | POA: Insufficient documentation

## 2017-12-01 DIAGNOSIS — Z794 Long term (current) use of insulin: Secondary | ICD-10-CM | POA: Diagnosis not present

## 2017-12-01 DIAGNOSIS — E860 Dehydration: Secondary | ICD-10-CM | POA: Insufficient documentation

## 2017-12-01 DIAGNOSIS — E1010 Type 1 diabetes mellitus with ketoacidosis without coma: Secondary | ICD-10-CM | POA: Diagnosis present

## 2017-12-01 DIAGNOSIS — Z716 Tobacco abuse counseling: Secondary | ICD-10-CM

## 2017-12-01 DIAGNOSIS — Z7984 Long term (current) use of oral hypoglycemic drugs: Secondary | ICD-10-CM | POA: Diagnosis not present

## 2017-12-01 HISTORY — DX: Depression, unspecified: F32.A

## 2017-12-01 HISTORY — DX: Benign neoplasm of unspecified breast: D24.9

## 2017-12-01 HISTORY — DX: Major depressive disorder, single episode, unspecified: F32.9

## 2017-12-01 LAB — GLUCOSE, CAPILLARY
Glucose-Capillary: 142 mg/dL — ABNORMAL HIGH (ref 65–99)
Glucose-Capillary: 148 mg/dL — ABNORMAL HIGH (ref 65–99)
Glucose-Capillary: 156 mg/dL — ABNORMAL HIGH (ref 65–99)
Glucose-Capillary: 165 mg/dL — ABNORMAL HIGH (ref 65–99)
Glucose-Capillary: 186 mg/dL — ABNORMAL HIGH (ref 65–99)
Glucose-Capillary: 200 mg/dL — ABNORMAL HIGH (ref 65–99)
Glucose-Capillary: 216 mg/dL — ABNORMAL HIGH (ref 65–99)

## 2017-12-01 LAB — URINALYSIS, ROUTINE W REFLEX MICROSCOPIC
Bacteria, UA: NONE SEEN
Bilirubin Urine: NEGATIVE
Glucose, UA: >=500 mg/dL — AB
Hgb urine dipstick: NEGATIVE
Ketones, ur: 80 mg/dL — AB
Leukocytes, UA: NEGATIVE
Nitrite: NEGATIVE
Protein, ur: NEGATIVE mg/dL
Specific Gravity, Urine: 1.008 (ref 1.005–1.030)
pH: 5 (ref 5.0–8.0)

## 2017-12-01 LAB — BASIC METABOLIC PANEL
Anion gap: 18 — ABNORMAL HIGH (ref 5–15)
Anion gap: 12 (ref 5–15)
BUN: 15 mg/dL (ref 6–20)
BUN: 12 mg/dL (ref 6–20)
CO2: 10 mmol/L — ABNORMAL LOW (ref 22–32)
CO2: 11 mmol/L — ABNORMAL LOW (ref 22–32)
Calcium: 8.7 mg/dL — ABNORMAL LOW (ref 8.9–10.3)
Calcium: 8 mg/dL — ABNORMAL LOW (ref 8.9–10.3)
Chloride: 106 mmol/L (ref 101–111)
Chloride: 106 mmol/L (ref 101–111)
Creatinine, Ser: 0.94 mg/dL (ref 0.44–1.00)
Creatinine, Ser: 0.84 mg/dL (ref 0.44–1.00)
GFR calc Af Amer: >60 mL/min (ref >60)
GFR calc Af Amer: >60 mL/min (ref >60)
GFR calc non Af Amer: >60 mL/min (ref >60)
GFR calc non Af Amer: >60 mL/min (ref >60)
Glucose, Bld: 210 mg/dL — ABNORMAL HIGH (ref 65–99)
Glucose, Bld: 153 mg/dL — ABNORMAL HIGH (ref 65–99)
Potassium: 2.9 mmol/L — ABNORMAL LOW (ref 3.5–5.1)
Potassium: 2.4 mmol/L — CL (ref 3.5–5.1)
Sodium: 134 mmol/L — ABNORMAL LOW (ref 135–145)
Sodium: 129 mmol/L — ABNORMAL LOW (ref 135–145)

## 2017-12-01 LAB — CBC
HCT: 46.6 % — ABNORMAL HIGH (ref 36.0–46.0)
Hemoglobin: 16.7 g/dL — ABNORMAL HIGH (ref 12.0–15.0)
MCH: 34.3 pg — ABNORMAL HIGH (ref 26.0–34.0)
MCHC: 35.8 g/dL (ref 30.0–36.0)
MCV: 95.7 fL (ref 78.0–100.0)
Platelets: 174 10*3/uL (ref 150–400)
RBC: 4.87 MIL/uL (ref 3.87–5.11)
RDW: 11.5 % (ref 11.5–15.5)
WBC: 4.6 10*3/uL (ref 4.0–10.5)

## 2017-12-01 LAB — I-STAT BETA HCG BLOOD, ED (MC, WL, AP ONLY): I-stat hCG, quantitative: <5 m[IU]/mL (ref <5)

## 2017-12-01 LAB — TSH: TSH: 1.646 u[IU]/mL (ref 0.350–4.500)

## 2017-12-01 LAB — BLOOD GAS, VENOUS
Acid-base deficit: 20.2 mmol/L — ABNORMAL HIGH (ref 0.0–2.0)
Acid-base deficit: 13.2 mmol/L — ABNORMAL HIGH (ref 0.0–2.0)
Bicarbonate: 9.5 mmol/L — ABNORMAL LOW (ref 20.0–28.0)
Bicarbonate: 14.7 mmol/L — ABNORMAL LOW (ref 20.0–28.0)
Drawn by: 15171
FIO2: 0.21
FIO2: 21
O2 Content: 21 L/min
O2 Saturation: 81.1 %
O2 Saturation: 92.5 %
Patient temperature: 37
pCO2, Ven: 25.8 mmHg — ABNORMAL LOW (ref 44.0–60.0)
pCO2, Ven: 22.8 mmHg — ABNORMAL LOW (ref 44.0–60.0)
pH, Ven: 7.099 — CL (ref 7.250–7.430)
pH, Ven: 7.327 (ref 7.250–7.430)
pO2, Ven: 54.8 mmHg — ABNORMAL HIGH (ref 32.0–45.0)
pO2, Ven: 64.6 mmHg — ABNORMAL HIGH (ref 32.0–45.0)

## 2017-12-01 LAB — COMPREHENSIVE METABOLIC PANEL
ALT: 27 U/L (ref 14–54)
AST: 23 U/L (ref 15–41)
Albumin: 4.6 g/dL (ref 3.5–5.0)
Alkaline Phosphatase: 91 U/L (ref 38–126)
Anion gap: >20 — ABNORMAL HIGH (ref 5–15)
BUN: 16 mg/dL (ref 6–20)
CO2: 9 mmol/L — ABNORMAL LOW (ref 22–32)
Calcium: 9.9 mg/dL (ref 8.9–10.3)
Chloride: 91 mmol/L — ABNORMAL LOW (ref 101–111)
Creatinine, Ser: 1.34 mg/dL — ABNORMAL HIGH (ref 0.44–1.00)
GFR calc Af Amer: 51 mL/min — ABNORMAL LOW (ref >60)
GFR calc non Af Amer: 44 mL/min — ABNORMAL LOW (ref >60)
Glucose, Bld: 387 mg/dL — ABNORMAL HIGH (ref 65–99)
Potassium: 3.1 mmol/L — ABNORMAL LOW (ref 3.5–5.1)
Sodium: 130 mmol/L — ABNORMAL LOW (ref 135–145)
Total Bilirubin: 2.2 mg/dL — ABNORMAL HIGH (ref 0.3–1.2)
Total Protein: 8.1 g/dL (ref 6.5–8.1)

## 2017-12-01 LAB — TROPONIN I
Troponin I: <0.03 ng/mL (ref <0.03)
Troponin I: <0.03 ng/mL (ref <0.03)

## 2017-12-01 LAB — MRSA PCR SCREENING: MRSA by PCR: NEGATIVE

## 2017-12-01 LAB — CBG MONITORING, ED
Glucose-Capillary: 400 mg/dL — ABNORMAL HIGH (ref 65–99)
Glucose-Capillary: 288 mg/dL — ABNORMAL HIGH (ref 65–99)

## 2017-12-01 LAB — ETHANOL: Alcohol, Ethyl (B): <10 mg/dL (ref <10)

## 2017-12-01 LAB — LIPASE, BLOOD: Lipase: 38 U/L (ref 11–51)

## 2017-12-01 LAB — PHOSPHORUS: Phosphorus: 3.1 mg/dL (ref 2.5–4.6)

## 2017-12-01 LAB — MAGNESIUM: Magnesium: 2 mg/dL (ref 1.7–2.4)

## 2017-12-01 MED ORDER — IBUPROFEN 400 MG PO TABS
600.0000 mg | ORAL_TABLET | Freq: Once | ORAL | Status: AC
  Administered 2017-12-01: 600 mg via ORAL
  Filled 2017-12-01: qty 2

## 2017-12-01 MED ORDER — SODIUM CHLORIDE 0.9 % IV SOLN
INTRAVENOUS | Status: DC
  Administered 2017-12-01: 14:00:00 via INTRAVENOUS

## 2017-12-01 MED ORDER — HYDRALAZINE HCL 20 MG/ML IJ SOLN: 10.0000 mg | Freq: Three times a day (TID) | INTRAMUSCULAR | Status: DC | PRN

## 2017-12-01 MED ORDER — PANTOPRAZOLE SODIUM 40 MG IV SOLR
40.0000 mg | INTRAVENOUS | Status: DC
  Administered 2017-12-01: 40 mg via INTRAVENOUS
  Filled 2017-12-01: qty 40

## 2017-12-01 MED ORDER — POTASSIUM CHLORIDE 10 MEQ/100ML IV SOLN
10.0000 meq | INTRAVENOUS | Status: AC
  Administered 2017-12-01 (×2): 10 meq via INTRAVENOUS
  Filled 2017-12-01 (×2): qty 100

## 2017-12-01 MED ORDER — ASPIRIN 81 MG PO CHEW
324.0000 mg | CHEWABLE_TABLET | Freq: Once | ORAL | Status: AC
  Administered 2017-12-01: 324 mg via ORAL
  Filled 2017-12-01: qty 4

## 2017-12-01 MED ORDER — SODIUM CHLORIDE 0.9 % IV SOLN: INTRAVENOUS | Status: DC

## 2017-12-01 MED ORDER — SODIUM CHLORIDE 0.9 % IV BOLUS (SEPSIS)
1000.0000 mL | Freq: Once | INTRAVENOUS | Status: AC
  Administered 2017-12-01: 1000 mL via INTRAVENOUS

## 2017-12-01 MED ORDER — DEXTROSE-NACL 5-0.45 % IV SOLN
INTRAVENOUS | Status: DC
  Administered 2017-12-01: 1 mL via INTRAVENOUS

## 2017-12-01 MED ORDER — DEXTROSE-NACL 5-0.45 % IV SOLN
INTRAVENOUS | Status: DC
  Administered 2017-12-01: 18:00:00 via INTRAVENOUS
  Administered 2017-12-02: 1 mL via INTRAVENOUS

## 2017-12-01 MED ORDER — SODIUM CHLORIDE 0.9 % IV SOLN
INTRAVENOUS | Status: DC
  Administered 2017-12-01: 3.4 [IU]/h via INTRAVENOUS
  Filled 2017-12-01: qty 1

## 2017-12-01 MED ORDER — SODIUM CHLORIDE 0.9 % IV BOLUS (SEPSIS)
500.0000 mL | Freq: Once | INTRAVENOUS | Status: AC
  Administered 2017-12-01: 500 mL via INTRAVENOUS

## 2017-12-01 MED ORDER — POTASSIUM CHLORIDE 10 MEQ/100ML IV SOLN
10.0000 meq | INTRAVENOUS | Status: AC
  Administered 2017-12-01: 10 meq via INTRAVENOUS
  Filled 2017-12-01: qty 100

## 2017-12-01 MED ORDER — ENOXAPARIN SODIUM 40 MG/0.4ML ~~LOC~~ SOLN
40.0000 mg | SUBCUTANEOUS | Status: DC
  Administered 2017-12-01: 40 mg via SUBCUTANEOUS
  Filled 2017-12-01: qty 0.4

## 2017-12-01 MED ORDER — FAMOTIDINE IN NACL 20-0.9 MG/50ML-% IV SOLN
20.0000 mg | Freq: Once | INTRAVENOUS | Status: AC
  Administered 2017-12-01: 20 mg via INTRAVENOUS
  Filled 2017-12-01: qty 50

## 2017-12-01 MED ORDER — POTASSIUM CHLORIDE CRYS ER 20 MEQ PO TBCR
40.0000 meq | EXTENDED_RELEASE_TABLET | Freq: Two times a day (BID) | ORAL | Status: AC
  Administered 2017-12-02 (×2): 40 meq via ORAL
  Filled 2017-12-01 (×2): qty 2

## 2017-12-01 MED ORDER — POTASSIUM CHLORIDE 10 MEQ/100ML IV SOLN
10.0000 meq | INTRAVENOUS | Status: AC
  Administered 2017-12-01 (×2): 10 meq via INTRAVENOUS
  Filled 2017-12-01: qty 100

## 2017-12-01 MED ORDER — ONDANSETRON HCL 4 MG/2ML IJ SOLN: 4.0000 mg | Freq: Three times a day (TID) | INTRAMUSCULAR | Status: DC | PRN

## 2017-12-01 NOTE — H&P (Signed)
[facility] History and Physical  Laurie Larson ZOX:[id number] DOB: 01/05/1961 DOA: 12/01/2017  Referring physician:  PCP: [facility]   Chief Complaint: "I just felt like sleeping.  HPI: Laurie Larson is a 57 y.o. female with past medical history significant for hypertension presents the emergency room with chief complaint of feeling tired.  Patient states that she has felt weak and drained for at least 4 days.  And her symptoms have progressively gotten worse.  She is unable to tolerate oral intake but has felt less thirsty even though her mouth is dry.  Denies diarrhea and abdominal pain.  Especially over the last 2 days patient has been feeling weak.  Patient has been sleeping the majority of the last 48 hours.  This alarmed her husband who brought her to the emergency room for evaluation.  Of note patient did spend the beginning of the new year in [place].  ED course: Patient found to be in DKA.  Given a bolus of normal saline.  Given K riders.  Insulin infusion started.  Hospitalist consulted for admission.   Review of Systems:  As per HPI otherwise 10 point review of systems negative.    Past Medical History:  Diagnosis Date  . Benign tumor of breast    removed  . Depressive episode    rersolved  . Diarrhea    GI Outlaw  . History of stomach ulcers   . Hypertension   . Rectal bleeding    Past Surgical History:  Procedure Laterality Date  . ABDOMINAL HYSTERECTOMY     Total  . BREAST ENHANCEMENT SURGERY    . BREAST SURGERY     benign right breast  . COLONOSCOPY  12/2012   polyps benign, diverticulosis, hemorrhoids, recheck in 3 years  . ESOPHAGOGASTRODUODENOSCOPY     normal esophagus,gastritis, duodenal erosions without bleeding, bx benign   Social History:  reports that she has been smoking cigarettes.  She has been smoking about 0.25 packs per day. she has never used smokeless tobacco. She reports that she drinks  about 4.2 oz of alcohol per week. She reports that she does not use drugs.  Allergies  Allergen Reactions  . Acetaminophen Anaphylaxis  . Lisinopril Hives and Swelling  . Norvasc [Amlodipine Besylate] Swelling    Family History  Problem Relation Age of Onset  . Ovarian cancer Maternal Aunt        deceased  . Juvenile Diabetes Maternal Aunt   . Cancer Maternal Grandmother        type unknown  . Breast cancer Sister   . Breast cancer Mother        deceased     Prior to Admission medications   Medication Sig Start Date End Date Taking? Authorizing Provider  losartan-hydrochlorothiazide (HYZAAR) 100-25 MG per tablet Take 1 tablet by mouth daily.   Yes [provider]  Multiple Vitamin (MULTIVITAMIN WITH MINERALS) TABS tablet Take 1 tablet by mouth daily.   Yes [provider]  omeprazole (PRILOSEC) 20 MG capsule Take 1 capsule by mouth daily. 09/14/17  Yes [provider]   Physical Exam: Vitals:   12/01/17 1530 12/01/17 1615 12/01/17 1630 12/01/17 1723  BP: (!) 149/96  (!) 151/101   Pulse: 85 66    Resp: 19 20 15    Temp:    (!) 97.5 F (36.4 C)  TempSrc:    Oral  SpO2: (!) 69% 100%    Weight:    53.2 kg (117 lb 4.6  oz)  Height:    5\' 5"  (1.651 m)    Wt Readings from Last 3 Encounters:  12/01/17 53.2 kg (117 lb 4.6 oz)  12/26/16 59 kg (130 lb)  01/31/15 64.3 kg (141 lb 12.8 oz)    General:  Appears calm and comfortable; a&Ox3 Eyes:  PERRL, EOMI, normal lids, iris ENT:  grossly normal hearing, lips & tongue Neck:  no LAD, masses or thyromegaly Cardiovascular:  RRR, no m/r/g. No LE edema.  Respiratory:  CTA bilaterally, no w/r/r. Tachypnea Abdomen:  soft, ntnd Skin:  no rash or induration seen on limited exam Musculoskeletal:  grossly normal tone BUE/BLE Psychiatric:  grossly normal mood and affect, speech fluent and appropriate Neurologic:  CN 2-12 grossly intact, moves all extremities in coordinated fashion.          Labs on  Admission:  Basic Metabolic Panel: Recent Labs  Lab 12/01/17 1348 12/01/17 1351  NA 130*  --   K 3.1*  --   CL 91*  --   CO2 9*  --   GLUCOSE 387*  --   BUN 16  --   CREATININE 1.34*  --   CALCIUM 9.9  --   MG  --  2.0  PHOS  --  3.1   Liver Function Tests: Recent Labs  Lab 12/01/17 1348  AST 23  ALT 27  ALKPHOS 91  BILITOT 2.2*  PROT 8.1  ALBUMIN 4.6   Recent Labs  Lab 12/01/17 1348  LIPASE 38   No results for input(s): AMMONIA in the last 168 hours. CBC: Recent Labs  Lab 12/01/17 1348  WBC 4.6  HGB 16.7*  HCT 46.6*  MCV 95.7  PLT 174   Cardiac Enzymes: Recent Labs  Lab 12/01/17 1348  TROPONINI <0.03    BNP (last 3 results) No results for input(s): BNP in the last 8760 hours.  ProBNP (last 3 results) No results for input(s): PROBNP in the last 8760 hours.   Serum creatinine: 1.34 mg/dL (H) 12/01/17 1348 Estimated creatinine clearance: 40.3 mL/min (A)  CBG: Recent Labs  Lab 12/01/17 1545 12/01/17 1647 12/01/17 1728  GLUCAP 400* 288* 216*    Radiological Exams on Admission: Dg Chest 2 View  Result Date: 12/01/2017 CLINICAL DATA:  Dizziness.  Chest pain. EXAM: CHEST  2 VIEW COMPARISON:  12/26/2016 FINDINGS: Cardiomediastinal silhouette is normal. Mediastinal contours appear intact. There is no evidence of focal airspace consolidation, pleural effusion or pneumothorax. Osseous structures are without acute abnormality. Soft tissues are grossly normal. IMPRESSION: No active cardiopulmonary disease. Electronically Signed   By: Fidela Salisbury M.D.   On: 12/01/2017 15:19   Ct Head Wo Contrast  Result Date: 12/01/2017 CLINICAL DATA:  Ataxia and dizziness.  Drowsiness. EXAM: CT HEAD WITHOUT CONTRAST TECHNIQUE: Contiguous axial images were obtained from the base of the skull through the vertex without intravenous contrast. COMPARISON:  None. FINDINGS: Brain: The ventricles are normal in size and configuration. There is no intracranial mass,  hemorrhage, extra-axial fluid collection, or midline shift. The gray-white compartments appear normal. No acute infarct evident. Vascular: There is no hyperdense vessel. No vascular calcification is appreciable. Skull: The bony calvarium appears intact. Sinuses/Orbits: Visualized paranasal sinuses are clear. There is a concha bullosa on each side, an anatomic variant. Visualized orbits appear symmetric bilaterally. Other: Visualized mastoid air cells are clear. IMPRESSION: Study within normal limits. Electronically Signed   By: Lowella Grip III M.D.   On: 12/01/2017 15:43    EKG: Independently reviewed. NSR. No STEMI.  Assessment/Plan Active Problems:   DKA (diabetic ketoacidoses) ([place])  DKA Started on standard protocol Insulin was started by EDP Bolus patient with more fluid Blood glucose above 300 so we will continue normal saline Check a magnesium and phosphorus Adding more K riders Discussed at length with patient and husband Endocrinology follow-up at discharge due to patient being 50+ with first episode  Hypertension When necessary hydralazine 10 mg IV as needed for severe blood pressure Hold oral med  GERD IV PPI qd Hold oral med   Code Status: FC  DVT Prophylaxis: lovenox Family Communication: husband Disposition Plan: Pending Improvement  Status: sdu inpt  Elwin Mocha, MD Family Medicine [facility] [email or web address] Password [email or web address]

## 2017-12-01 NOTE — ED Provider Notes (Signed)
[facility] EMERGENCY DEPARTMENT Provider Note   CSN: [id number] Arrival date & time: 12/01/17  1309    History   Chief Complaint Chief Complaint  Patient presents with  . Chest Pain    HPI Laurie Larson is a 57 y.o. female.  HPI Patient presented to the emergency room for evaluation of a few different complaints.  Patient states she overall started to have general malaise on Wednesday.  On Friday she started having discomfort in her chest.  The chest discomfort is vague and mild to moderate but she is also felt short of breath.  She is felt very fatigued and drowsy.  She feels like she is falling to the side when she tries to walk.  She has felt thirsty.  She describes numbness in both of her hands.  Body aches and occasional nausea and vomiting.  She describes some generalized abdominal discomfort intermittently but no pain right now.  Diarrhea or urinary symptoms.  Patient also states she is lost about 15 pounds in the last month.  Patient does smoke daily and she also drinks a glass of wine daily.  She denies any history of heart or lung disease. Past Medical History:  Diagnosis Date  . Benign hypertension   . Depression   . Diarrhea    GI Outlaw  . History of stomach ulcers   . Hypertension   . Rectal bleeding     Patient Active Problem List   Diagnosis Date Noted  . 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 ABDOMINAL HYSTERECTOMY    . BREAST ENHANCEMENT SURGERY    . BREAST SURGERY     benign right breast  . COLONOSCOPY  12/2012   polyps benign, diverticulosis, hemorrhoids, recheck in 3 years  . ESOPHAGOGASTRODUODENOSCOPY     normal esophagus,gastritis, duodenal erosions without bleeding, bx benign    OB History    No data available       Home Medications    Prior to Admission medications   Medication Sig Start Date End Date Taking? Authorizing Provider  losartan-hydrochlorothiazide (HYZAAR)  100-25 MG per tablet Take 1 tablet by mouth daily.   Yes [provider]  Multiple Vitamin (MULTIVITAMIN WITH MINERALS) TABS tablet Take 1 tablet by mouth daily.   Yes [provider]  omeprazole (PRILOSEC) 20 MG capsule Take 1 capsule by mouth daily. 09/14/17  Yes [provider]    Family History Family History  Problem Relation Age of Onset  . Ovarian cancer Maternal Aunt        deceased  . Cancer Maternal Grandmother        type unknown  . Breast cancer Sister   . Breast cancer Mother        deceased    Social History Social History   Tobacco Use  . Smoking status: Current Every Day Smoker    Packs/day: 0.25    Types: Cigarettes  . Smokeless tobacco: Never Used  Substance Use Topics  . Alcohol use: Yes    Alcohol/week: 4.2 oz    Types: 7 Glasses of wine per week    Comment: moderate  . Drug use: No     Allergies   Acetaminophen; Lisinopril; and Norvasc [amlodipine besylate]   Review of Systems Review of Systems  All other systems reviewed and are negative.    Physical Exam Updated Vital Signs BP (!) 149/96   Pulse 85   Temp (!) 97.5 F (36.4 C) (  Oral)   Resp 19   Ht 1.651 m (5\' 5" )   Wt 51.7 kg (114 lb)   SpO2 (!) 69%   BMI 18.97 kg/m   Physical Exam  Constitutional: She appears well-developed and well-nourished. She does not appear ill. No distress.  HENT:  Head: Normocephalic and atraumatic.  Right Ear: External ear normal.  Left Ear: External ear normal.  Eyes: Conjunctivae are normal. Right eye exhibits no discharge. Left eye exhibits no discharge. No scleral icterus.  Neck: Neck supple. No tracheal deviation present.  Cardiovascular: Normal rate, regular rhythm and intact distal pulses.  Pulmonary/Chest: Effort normal and breath sounds normal. No stridor. No respiratory distress. She has no wheezes. She has no rales.  Abdominal: Soft. Bowel sounds are normal. She exhibits no distension. There is no tenderness. There  is no rebound and no guarding.  Musculoskeletal: She exhibits no edema or tenderness.  Neurological: She is alert. She has normal strength. She displays no tremor. No cranial nerve deficit (no facial droop, extraocular movements intact, no slurred speech) or sensory deficit. She exhibits normal muscle tone. She displays no seizure activity. Coordination normal.  Normal finger to nose exam bilaterally  Skin: Skin is warm and dry. No rash noted.  Few telangiectasias on the skin  Psychiatric: She has a normal mood and affect.  Nursing note and vitals reviewed.    ED Treatments / Results  Labs (all labs ordered are listed, but only abnormal results are displayed) Labs Reviewed  CBC - Abnormal; Notable for the following components:      Result Value   Hemoglobin 16.7 (*)    HCT 46.6 (*)    MCH 34.3 (*)    All other components within normal limits  COMPREHENSIVE METABOLIC PANEL - Abnormal; Notable for the following components:   Sodium 130 (*)    Potassium 3.1 (*)    Chloride 91 (*)    CO2 9 (*)    Glucose, Bld 387 (*)    Creatinine, Ser 1.34 (*)    Total Bilirubin 2.2 (*)    GFR calc non Af Amer 44 (*)    GFR calc Af Amer 51 (*)    Anion gap >20 (*)    All other components within normal limits  CBG MONITORING, ED - Abnormal; Notable for the following components:   Glucose-Capillary 400 (*)    All other components within normal limits  ETHANOL  TROPONIN I  TSH  LIPASE, BLOOD  T4, FREE  BLOOD GAS, VENOUS  I-STAT BETA HCG BLOOD, ED ([facility], [facility], [facility] ONLY)    EKG  EKG Interpretation  Date/Time:  Sunday December 01 2017 13:24:25 EST Ventricular Rate:  77 PR Interval:    QRS Duration: 78 QT Interval:  367 QTC Calculation: 416 R Axis:   79 Text Interpretation:  Sinus rhythm Borderline T abnormalities, inferior leads t wave changes are new since feb 2018 Confirmed by Dorie Rank [phone]) on 12/01/2017 1:59:59 PM       Radiology Dg Chest 2 View  Result Date: 12/01/2017 CLINICAL  DATA:  Dizziness.  Chest pain. EXAM: CHEST  2 VIEW COMPARISON:  12/26/2016 FINDINGS: Cardiomediastinal silhouette is normal. Mediastinal contours appear intact. There is no evidence of focal airspace consolidation, pleural effusion or pneumothorax. Osseous structures are without acute abnormality. Soft tissues are grossly normal. IMPRESSION: No active cardiopulmonary disease. Electronically Signed   By: Fidela Salisbury M.D.   On: 12/01/2017 15:19   Ct Head Wo Contrast  Result Date: 12/01/2017 CLINICAL DATA:  Ataxia and dizziness.  Drowsiness. EXAM: CT HEAD WITHOUT CONTRAST TECHNIQUE: Contiguous axial images were obtained from the base of the skull through the vertex without intravenous contrast. COMPARISON:  None. FINDINGS: Brain: The ventricles are normal in size and configuration. There is no intracranial mass, hemorrhage, extra-axial fluid collection, or midline shift. The gray-white compartments appear normal. No acute infarct evident. Vascular: There is no hyperdense vessel. No vascular calcification is appreciable. Skull: The bony calvarium appears intact. Sinuses/Orbits: Visualized paranasal sinuses are clear. There is a concha bullosa on each side, an anatomic variant. Visualized orbits appear symmetric bilaterally. Other: Visualized mastoid air cells are clear. IMPRESSION: Study within normal limits. Electronically Signed   By: Lowella Grip III M.D.   On: 12/01/2017 15:43    Procedures .Critical Care Performed by: Dorie Rank, MD Authorized by: Dorie Rank, MD   Critical care provider statement:    Critical care time (minutes):  30   Critical care was necessary to treat or prevent imminent or life-threatening deterioration of the following conditions:  Endocrine crisis   Critical care was time spent personally by me on the following activities:  Discussions with consultants, evaluation of patient's response to treatment, examination of patient, ordering and performing treatments and  interventions, ordering and review of laboratory studies, ordering and review of radiographic studies, pulse oximetry, re-evaluation of patient's condition, obtaining history from patient or surrogate and review of old charts   (including critical care time)  Medications Ordered in ED Medications  0.9 %  sodium chloride infusion ( Intravenous New Bag/Given 12/01/17 1355)  potassium chloride 10 mEq in 100 mL IVPB (10 mEq Intravenous New Bag/Given 12/01/17 1551)  dextrose 5 %-0.45 % sodium chloride infusion (not administered)  insulin regular (NOVOLIN R,HUMULIN R) 100 Units in sodium chloride 0.9 % 100 mL (1 Units/mL) infusion (3.4 Units/hr Intravenous New Bag/Given 12/01/17 1549)  sodium chloride 0.9 % bolus 1,000 mL (not administered)  aspirin chewable tablet 324 mg (324 mg Oral Given 12/01/17 1355)  famotidine (PEPCID) IVPB 20 mg premix (0 mg Intravenous Stopped 12/01/17 1539)     Initial Impression / Assessment and Plan / ED Course  I have reviewed the triage vital signs and the nursing notes.  Pertinent labs & imaging results that were available during my care of the patient were reviewed by me and considered in my medical decision making (see chart for details).  Clinical Course as of Dec 02 1603  Sun Dec 01, 2017  1450 Patient's laboratory tests are notable for elevated blood sugar associated with a decreased bicarbonate  [JK]  1453 Concerning for DKA.  No history of DM  [JK]    Clinical Course User Index [JK] Dorie Rank, MD   Patient presented to the emergency room several complaints including weight loss, polydipsia, dizziness and some chest discomfort.  Her laboratory tests are indicative of DKA.  She has hyperglycemia associated with an anion.  It is a bit surprising that she has developed this in her 44s.  Patient will need further workup and evaluation.  I have started on insulin and potassium replacement.  I will consult the medical service for admission and further  treatment.  Final Clinical Impressions(s) / ED Diagnoses   Final diagnoses:  Diabetic ketoacidosis without coma associated with type 1 diabetes mellitus ([place])      Dorie Rank, MD 12/01/17 1606

## 2017-12-01 NOTE — ED Triage Notes (Addendum)
Patient c/o mid chest pain that started on Friday with shortness of breath, dizziness, drowsiness, extreme thirst, numbness in one hand, nausea, body aches, and vomiting. Per patient "hasd not felt well since Wednesday." Patient states generalized abd pain with nausea, vomiting, and chills prior to chest pain. Denies any diarrhea or urinary symptoms. Per patient weight loss of 15lbs in past month.

## 2017-12-02 DIAGNOSIS — E876 Hypokalemia: Secondary | ICD-10-CM | POA: Diagnosis not present

## 2017-12-02 DIAGNOSIS — I10 Essential (primary) hypertension: Secondary | ICD-10-CM

## 2017-12-02 DIAGNOSIS — E1110 Type 2 diabetes mellitus with ketoacidosis without coma: Secondary | ICD-10-CM

## 2017-12-02 DIAGNOSIS — Z716 Tobacco abuse counseling: Secondary | ICD-10-CM | POA: Diagnosis not present

## 2017-12-02 LAB — CBC WITH DIFFERENTIAL/PLATELET
Basophils Absolute: 0 10*3/uL (ref 0.0–0.1)
Basophils Relative: 1 %
Eosinophils Absolute: 0.1 10*3/uL (ref 0.0–0.7)
Eosinophils Relative: 2 %
HCT: 34.4 % — ABNORMAL LOW (ref 36.0–46.0)
Hemoglobin: 12.4 g/dL (ref 12.0–15.0)
Lymphocytes Relative: 38 %
Lymphs Abs: 1.5 10*3/uL (ref 0.7–4.0)
MCH: 33.6 pg (ref 26.0–34.0)
MCHC: 36 g/dL (ref 30.0–36.0)
MCV: 93.2 fL (ref 78.0–100.0)
Monocytes Absolute: 0.3 10*3/uL (ref 0.1–1.0)
Monocytes Relative: 8 %
Neutro Abs: 2.1 10*3/uL (ref 1.7–7.7)
Neutrophils Relative %: 51 %
Platelets: 125 10*3/uL — ABNORMAL LOW (ref 150–400)
RBC: 3.69 MIL/uL — ABNORMAL LOW (ref 3.87–5.11)
RDW: 11.5 % (ref 11.5–15.5)
WBC: 4 10*3/uL (ref 4.0–10.5)

## 2017-12-02 LAB — BASIC METABOLIC PANEL
Anion gap: 6 (ref 5–15)
Anion gap: 7 (ref 5–15)
Anion gap: 12 (ref 5–15)
BUN: 11 mg/dL (ref 6–20)
BUN: 11 mg/dL (ref 6–20)
BUN: 12 mg/dL (ref 6–20)
CO2: 17 mmol/L — ABNORMAL LOW (ref 22–32)
CO2: 18 mmol/L — ABNORMAL LOW (ref 22–32)
CO2: 16 mmol/L — ABNORMAL LOW (ref 22–32)
Calcium: 8.7 mg/dL — ABNORMAL LOW (ref 8.9–10.3)
Calcium: 8.6 mg/dL — ABNORMAL LOW (ref 8.9–10.3)
Calcium: 8.6 mg/dL — ABNORMAL LOW (ref 8.9–10.3)
Chloride: 111 mmol/L (ref 101–111)
Chloride: 108 mmol/L (ref 101–111)
Chloride: 103 mmol/L (ref 101–111)
Creatinine, Ser: 0.84 mg/dL (ref 0.44–1.00)
Creatinine, Ser: 0.73 mg/dL (ref 0.44–1.00)
Creatinine, Ser: 0.84 mg/dL (ref 0.44–1.00)
GFR calc Af Amer: >60 mL/min (ref >60)
GFR calc Af Amer: >60 mL/min (ref >60)
GFR calc Af Amer: >60 mL/min (ref >60)
GFR calc non Af Amer: >60 mL/min (ref >60)
GFR calc non Af Amer: >60 mL/min (ref >60)
GFR calc non Af Amer: >60 mL/min (ref >60)
Glucose, Bld: 148 mg/dL — ABNORMAL HIGH (ref 65–99)
Glucose, Bld: 128 mg/dL — ABNORMAL HIGH (ref 65–99)
Glucose, Bld: 318 mg/dL — ABNORMAL HIGH (ref 65–99)
Potassium: 3.6 mmol/L (ref 3.5–5.1)
Potassium: 2.6 mmol/L — CL (ref 3.5–5.1)
Potassium: 3 mmol/L — ABNORMAL LOW (ref 3.5–5.1)
Sodium: 134 mmol/L — ABNORMAL LOW (ref 135–145)
Sodium: 133 mmol/L — ABNORMAL LOW (ref 135–145)
Sodium: 131 mmol/L — ABNORMAL LOW (ref 135–145)

## 2017-12-02 LAB — GLUCOSE, CAPILLARY
Glucose-Capillary: 132 mg/dL — ABNORMAL HIGH (ref 65–99)
Glucose-Capillary: 137 mg/dL — ABNORMAL HIGH (ref 65–99)
Glucose-Capillary: 140 mg/dL — ABNORMAL HIGH (ref 65–99)
Glucose-Capillary: 144 mg/dL — ABNORMAL HIGH (ref 65–99)
Glucose-Capillary: 147 mg/dL — ABNORMAL HIGH (ref 65–99)
Glucose-Capillary: 148 mg/dL — ABNORMAL HIGH (ref 65–99)
Glucose-Capillary: 150 mg/dL — ABNORMAL HIGH (ref 65–99)
Glucose-Capillary: 153 mg/dL — ABNORMAL HIGH (ref 65–99)
Glucose-Capillary: 156 mg/dL — ABNORMAL HIGH (ref 65–99)
Glucose-Capillary: 165 mg/dL — ABNORMAL HIGH (ref 65–99)
Glucose-Capillary: 196 mg/dL — ABNORMAL HIGH (ref 65–99)
Glucose-Capillary: 218 mg/dL — ABNORMAL HIGH (ref 65–99)
Glucose-Capillary: 320 mg/dL — ABNORMAL HIGH (ref 65–99)

## 2017-12-02 LAB — HEMOGLOBIN A1C
Hgb A1c MFr Bld: 11.3 % — ABNORMAL HIGH (ref 4.8–5.6)
Mean Plasma Glucose: 277.61 mg/dL

## 2017-12-02 LAB — T4, FREE: Free T4: 0.87 ng/dL (ref 0.61–1.12)

## 2017-12-02 MED ORDER — BLOOD GLUCOSE METER KIT: PACK | 0 refills | Status: DC

## 2017-12-02 MED ORDER — LIVING WELL WITH DIABETES BOOK
Freq: Once | Status: AC
  Administered 2017-12-02: 08:00:00
  Filled 2017-12-02: qty 1

## 2017-12-02 MED ORDER — INSULIN ASPART 100 UNIT/ML ~~LOC~~ SOLN: 0.0000 [IU] | Freq: Every day | SUBCUTANEOUS | Status: DC

## 2017-12-02 MED ORDER — POTASSIUM CHLORIDE CRYS ER 20 MEQ PO TBCR
40.0000 meq | EXTENDED_RELEASE_TABLET | Freq: Once | ORAL | Status: AC
  Administered 2017-12-02: 40 meq via ORAL
  Filled 2017-12-02: qty 2

## 2017-12-02 MED ORDER — INSULIN STARTER KIT- PEN NEEDLES (ENGLISH)
1.0000 | Freq: Once | Status: AC
Start: 2017-12-02 — End: 2017-12-02
  Administered 2017-12-02: 1
  Filled 2017-12-02: qty 1

## 2017-12-02 MED ORDER — INSULIN GLARGINE 100 UNIT/ML ~~LOC~~ SOLN
12.0000 [IU] | Freq: Every day | SUBCUTANEOUS | Status: DC
  Administered 2017-12-02: 12 [IU] via SUBCUTANEOUS
  Filled 2017-12-02 (×2): qty 0.12

## 2017-12-02 MED ORDER — INSULIN ASPART 100 UNIT/ML ~~LOC~~ SOLN
0.0000 [IU] | Freq: Three times a day (TID) | SUBCUTANEOUS | Status: DC
  Administered 2017-12-02: 11 [IU] via SUBCUTANEOUS

## 2017-12-02 MED ORDER — INSULIN DETEMIR 100 UNIT/ML FLEXPEN: 15.0000 [IU] | PEN_INJECTOR | Freq: Every day | SUBCUTANEOUS | 0 refills | Status: DC

## 2017-12-02 MED ORDER — METFORMIN HCL 500 MG PO TABS: 500.0000 mg | ORAL_TABLET | Freq: Two times a day (BID) | ORAL | 0 refills | Status: DC

## 2017-12-02 MED ORDER — POTASSIUM CHLORIDE 10 MEQ/100ML IV SOLN: 10.0000 meq | INTRAVENOUS | Status: DC

## 2017-12-02 MED ORDER — INSULIN PEN NEEDLE 31G X 5 MM MISC: 1.0000 "application " | Freq: Every day | 0 refills | Status: DC

## 2017-12-02 MED ORDER — LOSARTAN POTASSIUM 100 MG PO TABS: 100.0000 mg | ORAL_TABLET | Freq: Every day | ORAL | 0 refills | Status: AC

## 2017-12-02 NOTE — Progress Notes (Addendum)
Inpatient Diabetes Program Recommendations  AACE/ADA: New Consensus Statement on Inpatient Glycemic Control (2015)  Target Ranges:  Prepandial:   less than 140 mg/dL      Peak postprandial:   less than 180 mg/dL (1-2 hours)      Critically ill patients:  140 - 180 mg/dL  Results for Laurie Larson, Laurie Larson (MRN [id number]) as of 12/02/2017 07:32  Ref. Range 12/02/2017 00:37 12/02/2017 01:31 12/02/2017 02:37 12/02/2017 03:34 12/02/2017 04:34 12/02/2017 05:32 12/02/2017 06:30  Glucose-Capillary Latest Ref Range: 65 - 99 mg/dL 137 (H) 148 (H) 140 (H) 147 (H) 144 (H) 150 (H) 153 (H)   Results for Laurie Larson, Laurie Larson (MRN [id number]) as of 12/02/2017 07:32  Ref. Range 12/01/2017 13:48 12/01/2017 18:46 12/01/2017 22:15 12/02/2017 03:37  Glucose Latest Ref Range: 65 - 99 mg/dL 387 (H) 210 (H) 153 (H) 148 (H)  Results for Laurie Larson, Laurie Larson (MRN [id number]) as of 12/02/2017 07:32  Ref. Range 12/02/2017 03:37  CO2 Latest Ref Range: 22 - 32 mmol/L 17 (L)  Results for Laurie Larson, Laurie Larson (MRN [id number]) as of 12/02/2017 07:32  Ref. Range 12/02/2017 03:37  Anion gap Latest Ref Range: 5 - 15  6   Review of Glycemic Control  Diabetes history: No Outpatient Diabetes medications: NA Current orders for Inpatient glycemic control: IV insulin drip per DKA order set  Inpatient Diabetes Program Recommendations:  Insulin - Basal: Once acidosis is cleared and MD ready to transition from IV to SQ insulin, please consider ordering Lantus 11 units Q4=24H (based on 53.9 kg x 0.2 units). Correction (SSI): Once acidosis is cleared and MD ready to transition from IV to SQ insulin, please consider ordering CBGs with Novolog 0-9 units Q4H (or ACHS if diet will be ordered). HgbA1C: A1C in process.  NOTE: Ordered Living Well with Diabetes book, insulin starter kit, RD consult for diet education, patient education videos on DM and insulin, and Bedside RNs to educate patient on insulin and DM. Will plan to see patient  today.  Addendum 12/02/17'@12'$ :45-Spoke with patient and her husband about new diabetes diagnosis.  Patient reports that she began to feel bad a few days prior to coming to the hospital. Patient reports an acute onset of hyperglycemia symptoms (polydispia, polyphagia, blurry vision, extreme weakness and tiredness. Of note, patient has an aunt that has Type 1 DM (dx in her 95's).  Discussed basic pathophysiology of DM Type 1 and Type 2, basic home care, importance of checking CBGs and maintaining good CBG control to prevent long-term and short-term complications. Reviewed glucose and A1C goals and explained what an A1C was. Informed patient that her A1C results were not in the chart yet. Reviewed signs and symptoms of hyperglycemia and hypoglycemia along with treatment for both. Discussed impact of nutrition, exercise, stress, sickness, and medications on diabetes control. Patient reports that she tries to eat a healthy diet and she exercises a few time a week.  Reviewed Living Well with diabetes booklet and encouraged patient to read through entire book. Discussed DKA and discussed basic pathophysiology. Discussed that a c-peptide has been ordered which will help with determining if patient is still making insulin.  Asked patient to check her glucose as directed by MD and to keep a log book of glucose readings. Explained how the doctor she follows up with can use the log book to continue to make adjustments with DM medications if needed. Patient reports that she has seen Dr. Lennette Bihari Via (PCP) in [place] and she will plan to follow up with him. Had noted patient  seen Dr. Cruzita Lederer (Endocrinologist) in 2016 for an adrenal nodule. Encouraged patient to establish care with Dr. Cruzita Lederer or a different Endocrinologist for DM management. Discussed various treatment options for DM control. Discussed insulin and patient is agreeable to using insulin if needed for DM control. Reviewed and demonstrated how to use vial/syringe and  insulin pens and patient states that she would prefer insulin pens if prescribed insulin at time of discharge. Discussed basal and rapid acting insulin. Educated patient and spouse on insulin pen use at home. Reviewed contents of insulin flexpen starter kit. Reviewed all steps of insulin pen including attachment of needle, 2-unit air shot, dialing up dose, giving injection, removing needle, disposal of sharps, storage of unused insulin, disposal of insulin etc. Patient able to provide successful return demonstration.   Informed patient that RN will be asking her to self-administer insulin to ensure proper technique and ability to administer self insulin shots.  Patient verbalized understanding of information discussed and she states that she has no further questions at this time related to diabetes.   RNs to provide ongoing basic DM education at bedside with this patient and engage patient to actively check blood glucose and administer insulin injections. With patient having a family history of Type 1 DM, acute onset of symptoms, presenting to hospital in DKA, lean body weight, and insulin sensitive question if patient has Type 1 DM versus Type 2.  If patient has Type 1 DM she will require basal, correction, and meal coverage insulin. Have consulted CM to see which insulin are preferred for insurance in case insulin prescribed at discharge.  At time of discharge, patient will need prescription for glucometer and testing supplies. If patient is discharged on insulin, MD to give patient Rxs for insulin pens and insulin pen needles.  Thanks, Laurie Alderman, RN, MSN, CDE Diabetes Coordinator Inpatient Diabetes Program [phone] (Team Pager from 8am to 5pm)

## 2017-12-02 NOTE — Progress Notes (Signed)
  RD consulted for nutrition education regarding diabetes. The patient is a very pleasant 57 yo female with newly diagnosed Diabetes (question Type 1 vs Type 2 per diabetes coordinator).   Lab Results  Component Value Date   HGBA1C 11.3 (H) 12/02/2017    RD provided "Carbohydrate Counting for People with Diabetes" handout from the Academy of Nutrition and Dietetics. Discussed different food groups and their effects on blood sugar, emphasizing carbohydrate-containing foods. Provided list of carbohydrates and recommended serving sizes of common foods.  Discussed importance of controlled and consistent carbohydrate intake throughout the day. Provided examples of ways to balance meals/snacks and encouraged intake of high-fiber, whole grain complex carbohydrates.   Teach back method used. We reviewed label reading details and talked about good snack options for her prior to going to the gym.   Expect excellent compliance. No barriers identified. She had a number of good questions and was very engaged in the learning process. The patient is already a healthy weight and works out at [employer] regularly. She does not consume sugar laden beverages. Drinks unsweetened  tea and black coffee.   Body mass index is 19.77 kg/m. Pt meets criteria for normal based on current BMI.  Current diet order is CHO modifed, patient is consuming approximately 25-50% of meals at this time. Labs and medications reviewed. No further nutrition interventions warranted at this time. RD contact information provided. If additional nutrition issues arise, please re-consult RD.  Colman Cater MS,RD,CSG,LDN Office: [phone] Pager: [phone]

## 2017-12-02 NOTE — Discharge Instructions (Signed)
Blood Glucose Monitoring, Adult Monitoring your blood sugar (glucose) helps you manage your diabetes. It also helps you and your health care provider determine how well your diabetes management plan is working. Blood glucose monitoring involves checking your blood glucose as often as directed, and keeping a record (log) of your results over time. Why should I monitor my blood glucose? Checking your blood glucose regularly can:  Help you understand how food, exercise, illnesses, and medicines affect your blood glucose.  Let you know what your blood glucose is at any time. You can quickly tell if you are having low blood glucose (hypoglycemia) or high blood glucose (hyperglycemia).  Help you and your health care provider adjust your medicines as needed.  When should I check my blood glucose? Follow instructions from your health care provider about how often to check your blood glucose. This may depend on:  The type of diabetes you have.  How well-controlled your diabetes is.  Medicines you are taking.  If you have type 1 diabetes:  Check your blood glucose at least 2 times a day.  Also check your blood glucose: ? Before every insulin injection. ? Before and after exercise. ? Between meals. ? 2 hours after a meal. ? Occasionally between 2:00 a.m. and 3:00 a.m., as directed. ? Before potentially dangerous tasks, like driving or using heavy machinery. ? At bedtime.  You may need to check your blood glucose more often, up to 6-10 times a day: ? If you use an insulin pump. ? If you need multiple daily injections (MDI). ? If your diabetes is not well-controlled. ? If you are ill. ? If you have a history of severe hypoglycemia. ? If you have a history of not knowing when your blood glucose is getting low (hypoglycemia unawareness). If you have type 2 diabetes:  If you take insulin or other diabetes medicines, check your blood glucose at least 2 times a day.  If you are on intensive  insulin therapy, check your blood glucose at least 4 times a day. Occasionally, you may also need to check between 2:00 a.m. and 3:00 a.m., as directed.  Also check your blood glucose: ? Before and after exercise. ? Before potentially dangerous tasks, like driving or using heavy machinery.  You may need to check your blood glucose more often if: ? Your medicine is being adjusted. ? Your diabetes is not well-controlled. ? You are ill. What is a blood glucose log?  A blood glucose log is a record of your blood glucose readings. It helps you and your health care provider: ? Look for patterns in your blood glucose over time. ? Adjust your diabetes management plan as needed.  Every time you check your blood glucose, write down your result and notes about things that may be affecting your blood glucose, such as your diet and exercise for the day.  Most glucose meters store a record of glucose readings in the meter. Some meters allow you to download your records to a computer. How do I check my blood glucose? Follow these steps to get accurate readings of your blood glucose: Supplies needed   Blood glucose meter.  Test strips for your meter. Each meter has its own strips. You must use the strips that come with your meter.  A needle to prick your finger (lancet). Do not use lancets more than once.  A device that holds the lancet (lancing device).  A journal or log book to write down your results. Procedure  Wash your hands with soap and water.  Prick the side of your finger (not the tip) with the lancet. Use a different finger each time.  Gently rub the finger until a small drop of blood appears.  Follow instructions that come with your meter for inserting the test strip, applying blood to the strip, and using your blood glucose meter.  Write down your result and any notes. Alternative testing sites  Some meters allow you to use areas of your body other than your finger  (alternative sites) to test your blood.  If you think you may have hypoglycemia, or if you have hypoglycemia unawareness, do not use alternative sites. Use your finger instead.  Alternative sites may not be as accurate as the fingers, because blood flow is slower in these areas. This means that the result you get may be delayed, and it may be different from the result that you would get from your finger.  The most common alternative sites are: ? Forearm. ? Thigh. ? Palm of the hand. Additional tips  Always keep your supplies with you.  If you have questions or need help, all blood glucose meters have a 24-hour hotline number that you can call. You may also contact your health care provider.  After you use a few boxes of test strips, adjust (calibrate) your blood glucose meter by following instructions that came with your meter. This information is not intended to replace advice given to you by your health care provider. Make sure you discuss any questions you have with your health care provider. Document Released: 11/01/2003 Document Revised: 05/18/2016 Document Reviewed: 04/09/2016 Elsevier Interactive Patient Education  2017 [place].    Diabetes Mellitus and Nutrition When you have diabetes (diabetes mellitus), it is very important to have healthy eating habits because your blood sugar (glucose) levels are greatly affected by what you eat and drink. Eating healthy foods in the appropriate amounts, at about the same times every day, can help you:  Control your blood glucose.  Lower your risk of heart disease.  Improve your blood pressure.  Reach or maintain a healthy weight.  Every person with diabetes is different, and each person has different needs for a meal plan. Your health care provider may recommend that you work with a diet and nutrition specialist (dietitian) to make a meal plan that is best for you. Your meal plan may vary depending on factors such as:  The  calories you need.  The medicines you take.  Your weight.  Your blood glucose, blood pressure, and cholesterol levels.  Your activity level.  Other health conditions you have, such as heart or kidney disease.  How do carbohydrates affect me? Carbohydrates affect your blood glucose level more than any other type of food. Eating carbohydrates naturally increases the amount of glucose in your blood. Carbohydrate counting is a method for keeping track of how many carbohydrates you eat. Counting carbohydrates is important to keep your blood glucose at a healthy level, especially if you use insulin or take certain oral diabetes medicines. It is important to know how many carbohydrates you can safely have in each meal. This is different for every person. Your dietitian can help you calculate how many carbohydrates you should have at each meal and for snack. Foods that contain carbohydrates include:  Bread, cereal, rice, pasta, and crackers.  Potatoes and corn.  Peas, beans, and lentils.  Milk and yogurt.  Fruit and juice.  Desserts, such as cakes, cookies, ice cream,  and candy.  How does alcohol affect me? Alcohol can cause a sudden decrease in blood glucose (hypoglycemia), especially if you use insulin or take certain oral diabetes medicines. Hypoglycemia can be a life-threatening condition. Symptoms of hypoglycemia (sleepiness, dizziness, and confusion) are similar to symptoms of having too much alcohol. If your health care provider says that alcohol is safe for you, follow these guidelines:  Limit alcohol intake to no more than 1 drink per day for nonpregnant women and 2 drinks per day for men. One drink equals 12 oz of beer, 5 oz of wine, or 1 oz of hard liquor.  Do not drink on an empty stomach.  Keep yourself hydrated with water, diet soda, or unsweetened iced tea.  Keep in mind that regular soda, juice, and other mixers may contain a lot of sugar and must be counted as  carbohydrates.  What are tips for following this plan? Reading food labels  Start by checking the serving size on the label. The amount of calories, carbohydrates, fats, and other nutrients listed on the label are based on one serving of the food. Many foods contain more than one serving per package.  Check the total grams (g) of carbohydrates in one serving. You can calculate the number of servings of carbohydrates in one serving by dividing the total carbohydrates by 15. For example, if a food has 30 g of total carbohydrates, it would be equal to 2 servings of carbohydrates.  Check the number of grams (g) of saturated and trans fats in one serving. Choose foods that have low or no amount of these fats.  Check the number of milligrams (mg) of sodium in one serving. Most people should limit total sodium intake to less than 2,300 mg per day.  Always check the nutrition information of foods labeled as "low-fat" or "nonfat". These foods may be higher in added sugar or refined carbohydrates and should be avoided.  Talk to your dietitian to identify your daily goals for nutrients listed on the label. Shopping  Avoid buying canned, premade, or processed foods. These foods tend to be high in fat, sodium, and added sugar.  Shop around the outside edge of the grocery store. This includes fresh fruits and vegetables, bulk grains, fresh meats, and fresh dairy. Cooking  Use low-heat cooking methods, such as baking, instead of high-heat cooking methods like deep frying.  Cook using healthy oils, such as olive, canola, or sunflower oil.  Avoid cooking with butter, cream, or high-fat meats. Meal planning  Eat meals and snacks regularly, preferably at the same times every day. Avoid going long periods of time without eating.  Eat foods high in fiber, such as fresh fruits, vegetables, beans, and whole grains. Talk to your dietitian about how many servings of carbohydrates you can eat at each  meal.  Eat 4-6 ounces of lean protein each day, such as lean meat, chicken, fish, eggs, or tofu. 1 ounce is equal to 1 ounce of meat, chicken, or fish, 1 egg, or 1/4 cup of tofu.  Eat some foods each day that contain healthy fats, such as avocado, nuts, seeds, and fish. Lifestyle   Check your blood glucose regularly.  Exercise at least 30 minutes 5 or more days each week, or as told by your health care provider.  Take medicines as told by your health care provider.  Do not use any products that contain nicotine or tobacco, such as cigarettes and e-cigarettes. If you need help quitting, ask your health care  provider.  Work with a Social worker or diabetes educator to identify strategies to manage stress and any emotional and social challenges. What are some questions to ask my health care provider?  Do I need to meet with a diabetes educator?  Do I need to meet with a dietitian?  What number can I call if I have questions?  When are the best times to check my blood glucose? Where to find more information:  American Diabetes Association: diabetes.org/food-and-fitness/food  Academy of Nutrition and Dietetics: PokerClues.dk  [employer] of Diabetes and Digestive and Kidney Diseases (NIH): ContactWire.be Summary  A healthy meal plan will help you control your blood glucose and maintain a healthy lifestyle.  Working with a diet and nutrition specialist (dietitian) can help you make a meal plan that is best for you.  Keep in mind that carbohydrates and alcohol have immediate effects on your blood glucose levels. It is important to count carbohydrates and to use alcohol carefully. This information is not intended to replace advice given to you by your health care provider. Make sure you discuss any questions you have with your health care provider. Document  Released: 07/26/2005 Document Revised: 12/03/2016 Document Reviewed: 12/03/2016 Elsevier Interactive Patient Education  [employer].

## 2017-12-02 NOTE — Discharge Summary (Addendum)
Physician Discharge Summary  Laurie Larson HYQ:[id number] DOB: 1961-08-14 DOA: 12/01/2017  PCP: Dineen Kid , MD ( [place])  Admit date: 12/01/2017 Discharge date: 12/02/2017  Admitted From: Home Disposition:  Home  Recommendations for Outpatient Follow-up:  1. Follow up with PCP in 1-2 weeks. Please monitor electrolytes (potassium) during outpatient follow-up. 2. Patient is being discharged on metformin 500 mg twice a day and Levemir 15 units daily at bedtime. She is instructed to keep a log of her blood glucose reading and show it to her PCP during outpatient follow-up. Patient was started on blood glucose monitoring and use of insulin and also provided resource on meal planning.  Home Health: None Equipment/Devices: None  Discharge Condition: Home CODE STATUS: Full code Diet recommendation:  Carb Modified   Discharge Diagnoses:  Active Problems:   DKA (diabetic ketoacidoses) ([place])   Essential hypertension   Hypokalemia   Tobacco abuse counseling   DKA, type 2 (H[facility])  Brief narrative/history of present illness 57 year old female with hypertension, presented with generalized weakness for past few weeks, dry mouth, polyuria and polydipsia. Symptoms progressively worsened with blurred vision and went to see an ophthalmologist recently who told her she had bilateral posterior early cataracts. For the past 2 days she has been extremely weak and sleeping most of the day. In the ED she was found to be in DKA and was initiated on glucose stabilizer protocol and observe in the ICU on insulin drip and aggressive IV fluids.   Hospital course DKA (diabetes ketoacidosis), type II -It appears that patient was mildly prediabetes dating back to 2011 (hemoglobin A1c of 5.8). On reviewing labs about 2 years back in the system her blood glucose was in low 110s. Patient also reports strong family history of diabetes. Placed on insulin drip and aggressive IV fluids. Anion  gap closed this morning and was given 12 units of subcutaneous Lantus and transitioned to sliding scale coverage. -Hemoglobin A1c of 11.3 g/dL. -Patient will be discharged on Levemir flex pen 15 units daily at bedtime along with metformin 500 mg twice daily. -She has been prescribed glucometer and test supplies along with insulin and provided adequate teaching by nurses on blood glucose monitoring and using the flexible pen. -She is instructed to keep a log of her blood glucose monitoring at least twice daily (once in the morning fasting and once in the evening preferably) and show it to her PCP during outpatient follow-up next week. -Patient also provided with resource on meal planning and blood glucose monitoring at home. -C-peptide ordered this morning and should be followed as outpatient.   Hypokalemia Secondary to severe dehydration. Replenished aggressively. Will discontinue HCTZ for now.  Essential hypertension Continue losartan. Holding HCTZ for severe hypokalemia.  Tobacco abuse Counseled strongly on smoking cessation.  Consults: None Procedure: None Disposition: Home  Family communication: Husband at bedside   Discharge Instructions   Allergies as of 12/02/2017      Reactions   Acetaminophen Anaphylaxis   Lisinopril Hives, Swelling   Norvasc [amlodipine Besylate] Swelling      Medication List    STOP taking these medications   losartan-hydrochlorothiazide 100-25 MG tablet Commonly known as:  HYZAAR     TAKE these medications   blood glucose meter kit and supplies Dispense based on patient and insurance preference. Use up to four times daily as directed. (FOR ICD-10 E10.9, E11.9).   Insulin Detemir 100 UNIT/ML Pen Commonly known as:  LEVEMIR Inject 15 Units into the skin daily at  10 pm.   Insulin Pen Needle 31G X 5 MM Misc 1 application by Does not apply route at bedtime.   losartan 100 MG tablet Commonly known as:  COZAAR Take 1 tablet (100 mg total) by  mouth daily.   metFORMIN 500 MG tablet Commonly known as:  GLUCOPHAGE Take 1 tablet (500 mg total) by mouth 2 (two) times daily with a meal.   multivitamin with minerals Tabs tablet Take 1 tablet by mouth daily.   omeprazole 20 MG capsule Commonly known as:  PRILOSEC Take 1 capsule by mouth daily.      Follow-up Information    [facility]. Schedule an appointment as soon as possible for a visit in 1 week(s).   Specialty:  Family Medicine Contact information: [address] [place] [address] [phone]          Allergies  Allergen Reactions  . Acetaminophen Anaphylaxis  . Lisinopril Hives and Swelling  . Norvasc [Amlodipine Besylate] Swelling         Procedures/Studies: Dg Chest 2 View  Result Date: 12/01/2017 CLINICAL DATA:  Dizziness.  Chest pain. EXAM: CHEST  2 VIEW COMPARISON:  12/26/2016 FINDINGS: Cardiomediastinal silhouette is normal. Mediastinal contours appear intact. There is no evidence of focal airspace consolidation, pleural effusion or pneumothorax. Osseous structures are without acute abnormality. Soft tissues are grossly normal. IMPRESSION: No active cardiopulmonary disease. Electronically Signed   By: Fidela Salisbury M.D.   On: 12/01/2017 15:19   Ct Head Wo Contrast  Result Date: 12/01/2017 CLINICAL DATA:  Ataxia and dizziness.  Drowsiness. EXAM: CT HEAD WITHOUT CONTRAST TECHNIQUE: Contiguous axial images were obtained from the base of the skull through the vertex without intravenous contrast. COMPARISON:  None. FINDINGS: Brain: The ventricles are normal in size and configuration. There is no intracranial mass, hemorrhage, extra-axial fluid collection, or midline shift. The gray-white compartments appear normal. No acute infarct evident. Vascular: There is no hyperdense vessel. No vascular calcification is appreciable. Skull: The bony calvarium appears intact. Sinuses/Orbits: Visualized paranasal sinuses  are clear. There is a concha bullosa on each side, an anatomic variant. Visualized orbits appear symmetric bilaterally. Other: Visualized mastoid air cells are clear. IMPRESSION: Study within normal limits. Electronically Signed   By: Lowella Grip III M.D.   On: 12/01/2017 15:43       Subjective: Feels much better this morning. Denies dry mouth or weakness.  Discharge Exam: Vitals:   12/02/17 1118 12/02/17 1300  BP:  109/83  Pulse: 70   Resp: 14 14  Temp: (!) 97.4 F (36.3 C)   SpO2: 100%    Vitals:   12/02/17 1000 12/02/17 1100 12/02/17 1118 12/02/17 1300  BP: 137/77 (!) 152/85  109/83  Pulse: 62 63 70   Resp: _0 Temp:   (!) 97.4 F (36.3 C)   TempSrc:   Axillary   SpO2: 100% 100% 100%   Weight:      Height:        General: Middle aged female not in distress HEENT: No pallor, moist mucosa, supple neck Chest: Clear bilaterally CVS: S1 and S2, no murmurs rub or gallop GI: Soft, nondistended, nontender, bowel sounds present Musculoskeletal: Warm, no edema    The results of significant diagnostics from this hospitalization (including imaging, microbiology, ancillary and laboratory) are listed below for reference.     Microbiology: Recent Results (from the past 240 hour(s))  MRSA PCR Screening     Status: None  Collection Time: 12/01/17  5:20 PM  Result Value Ref Range Status   MRSA by PCR NEGATIVE NEGATIVE Final    Comment:        The GeneXpert MRSA Assay (FDA approved for NASAL specimens only), is one component of a comprehensive MRSA colonization surveillance program. It is not intended to diagnose MRSA infection nor to guide or monitor treatment for MRSA infections.      Labs: BNP (last 3 results) No results for input(s): BNP in the last 8760 hours. Basic Metabolic Panel: Recent Labs  Lab 12/01/17 1351 12/01/17 1846 12/01/17 2215 12/02/17 0337 12/02/17 0818 12/02/17 1648  NA  --  134* 129* 134* 133* 131*  K  --  2.9* 2.4* 3.6  2.6* 3.0*  CL  --  106 106 111 108 103  CO2  --  10* 11* 17* 18* 16*  GLUCOSE  --  210* 153* 148* 128* 318*  BUN  --  _0 CREATININE  --  0.94 0.84 0.84 0.73 0.84  CALCIUM  --  8.7* 8.0* 8.7* 8.6* 8.6*  MG 2.0  --   --   --   --   --   PHOS 3.1  --   --   --   --   --    Liver Function Tests: Recent Labs  Lab 12/01/17 1348  AST 23  ALT 27  ALKPHOS 91  BILITOT 2.2*  PROT 8.1  ALBUMIN 4.6   Recent Labs  Lab 12/01/17 1348  LIPASE 38   No results for input(s): AMMONIA in the last 168 hours. CBC: Recent Labs  Lab 12/01/17 1348 12/02/17 0337  WBC 4.6 4.0  NEUTROABS  --  2.1  HGB 16.7* 12.4  HCT 46.6* 34.4*  MCV 95.7 93.2  PLT 174 125*   Cardiac Enzymes: Recent Labs  Lab 12/01/17 1348 12/01/17 1846  TROPONINI <0.03 <0.03   BNP: Invalid input(s): POCBNP CBG: Recent Labs  Lab 12/02/17 0840 12/02/17 0938 12/02/17 1036 12/02/17 1116 12/02/17 1649  GLUCAP 132* 156* 196* 218* 320*   D-Dimer No results for input(s): DDIMER in the last 72 hours. Hgb A1c Recent Labs    12/02/17 0730  HGBA1C 11.3*   Lipid Profile No results for input(s): CHOL, HDL, LDLCALC, TRIG, CHOLHDL, LDLDIRECT in the last 72 hours. Thyroid function studies Recent Labs    12/01/17 1349  TSH 1.646   Anemia work up No results for input(s): VITAMINB12, FOLATE, FERRITIN, TIBC, IRON, RETICCTPCT in the last 72 hours. Urinalysis    Component Value Date/Time   COLORURINE STRAW (A) 12/01/2017 1626   APPEARANCEUR CLEAR 12/01/2017 1626   LABSPEC 1.008 12/01/2017 1626   PHURINE 5.0 12/01/2017 1626   GLUCOSEU >=500 (A) 12/01/2017 1626   HGBUR NEGATIVE 12/01/2017 1626   BILIRUBINUR NEGATIVE 12/01/2017 1626   KETONESUR 80 (A) 12/01/2017 1626   PROTEINUR NEGATIVE 12/01/2017 1626   NITRITE NEGATIVE 12/01/2017 1626   LEUKOCYTESUR NEGATIVE 12/01/2017 1626   Sepsis Labs Invalid input(s): PROCALCITONIN,  WBC,  LACTICIDVEN Microbiology Recent Results (from the past 240 hour(s))   MRSA PCR Screening     Status: None   Collection Time: 12/01/17  5:20 PM  Result Value Ref Range Status   MRSA by PCR NEGATIVE NEGATIVE Final    Comment:        The GeneXpert MRSA Assay (FDA approved for NASAL specimens only), is one component of a comprehensive MRSA colonization surveillance program. It is not intended to diagnose MRSA infection nor  to guide or monitor treatment for MRSA infections.      Time coordinating discharge: < 30 minutes  SIGNED:   Louellen Molder, MD  [facility] 12/02/2017, 5:33 PM Pager   If 7PM-7AM, please contact night-coverage [email or web address] Password [email or web address]

## 2017-12-02 NOTE — Care Management (Addendum)
Benefits check results are as follows:     S/W DESIRAE @ [facility] RX # [phone]   PREFERRED INSULINS:  LEVEMIR  NOVOLOG  TRESIBA   PRIOR APPROVAL- NO   NON PREFERRED INSULINS  LANTUS  HUMALOG    PREFERRED PHARMACY : [facility]

## 2017-12-03 LAB — C-PEPTIDE: C-Peptide: 0.6 ng/mL — ABNORMAL LOW (ref 1.1–4.4)

## 2017-12-03 LAB — HIV ANTIBODY (ROUTINE TESTING W REFLEX): HIV Screen 4th Generation wRfx: NONREACTIVE

## 2018-09-10 ENCOUNTER — Other Ambulatory Visit: Payer: Self-pay | Admitting: Family Medicine

## 2018-09-10 DIAGNOSIS — R921 Mammographic calcification found on diagnostic imaging of breast: Secondary | ICD-10-CM

## 2018-09-29 ENCOUNTER — Ambulatory Visit
Admission: RE | Admit: 2018-09-29 | Discharge: 2018-09-29 | Disposition: A | Discharge status: Home / Self Care | Payer: Commercial Managed Care - PPO | Source: Ambulatory Visit | Attending: Family Medicine | Admitting: Family Medicine

## 2018-09-29 ENCOUNTER — Other Ambulatory Visit: Payer: Self-pay | Admitting: Family Medicine

## 2018-09-29 DIAGNOSIS — R921 Mammographic calcification found on diagnostic imaging of breast: Secondary | ICD-10-CM

## 2019-09-10 ENCOUNTER — Other Ambulatory Visit: Payer: Self-pay | Admitting: Family Medicine

## 2019-09-10 DIAGNOSIS — Z1231 Encounter for screening mammogram for malignant neoplasm of breast: Secondary | ICD-10-CM

## 2019-10-29 ENCOUNTER — Ambulatory Visit
Admission: RE | Admit: 2019-10-29 | Discharge: 2019-10-29 | Disposition: A | Discharge status: Home / Self Care | Payer: Commercial Managed Care - PPO | Source: Ambulatory Visit | Attending: Family Medicine | Admitting: Family Medicine

## 2019-10-29 ENCOUNTER — Other Ambulatory Visit: Payer: Self-pay

## 2019-10-29 DIAGNOSIS — Z1231 Encounter for screening mammogram for malignant neoplasm of breast: Secondary | ICD-10-CM

## 2019-11-02 ENCOUNTER — Other Ambulatory Visit: Payer: Self-pay | Admitting: Family Medicine

## 2019-11-02 DIAGNOSIS — R928 Other abnormal and inconclusive findings on diagnostic imaging of breast: Secondary | ICD-10-CM

## 2019-11-04 ENCOUNTER — Ambulatory Visit
Admission: RE | Admit: 2019-11-04 | Discharge: 2019-11-04 | Disposition: A | Discharge status: Home / Self Care | Payer: Commercial Managed Care - PPO | Source: Ambulatory Visit | Attending: Family Medicine | Admitting: Family Medicine

## 2019-11-04 ENCOUNTER — Other Ambulatory Visit: Payer: Self-pay

## 2019-11-04 DIAGNOSIS — R928 Other abnormal and inconclusive findings on diagnostic imaging of breast: Secondary | ICD-10-CM

## 2019-11-12 ENCOUNTER — Other Ambulatory Visit: Payer: Commercial Managed Care - PPO

## 2020-10-31 ENCOUNTER — Other Ambulatory Visit: Payer: Self-pay | Admitting: Family Medicine

## 2020-10-31 ENCOUNTER — Other Ambulatory Visit: Payer: Self-pay | Admitting: "Endocrinology

## 2020-10-31 DIAGNOSIS — Z1231 Encounter for screening mammogram for malignant neoplasm of breast: Secondary | ICD-10-CM

## 2020-11-24 ENCOUNTER — Other Ambulatory Visit: Payer: Self-pay | Admitting: Family Medicine

## 2020-11-24 DIAGNOSIS — N6489 Other specified disorders of breast: Secondary | ICD-10-CM

## 2020-12-08 ENCOUNTER — Other Ambulatory Visit: Payer: Self-pay | Admitting: Family Medicine

## 2020-12-08 ENCOUNTER — Ambulatory Visit: Payer: Commercial Managed Care - PPO

## 2020-12-08 ENCOUNTER — Other Ambulatory Visit: Payer: Self-pay

## 2020-12-08 ENCOUNTER — Ambulatory Visit
Admission: RE | Admit: 2020-12-08 | Discharge: 2020-12-08 | Disposition: A | Discharge status: Home / Self Care | Payer: Commercial Managed Care - PPO | Source: Ambulatory Visit | Attending: Family Medicine | Admitting: Family Medicine

## 2020-12-08 DIAGNOSIS — N6489 Other specified disorders of breast: Secondary | ICD-10-CM

## 2020-12-16 ENCOUNTER — Ambulatory Visit: Payer: Commercial Managed Care - PPO

## 2021-01-21 ENCOUNTER — Encounter (HOSPITAL_COMMUNITY): Payer: Self-pay

## 2021-01-21 ENCOUNTER — Inpatient Hospital Stay (HOSPITAL_COMMUNITY)
Admission: EM | Admit: 2021-01-21 | Discharge: 2021-01-25 | DRG: 377 | Discharge status: Against Medical Advice | Payer: 59 | Attending: Family Medicine | Admitting: Family Medicine

## 2021-01-21 ENCOUNTER — Emergency Department (HOSPITAL_COMMUNITY): Payer: 59

## 2021-01-21 ENCOUNTER — Other Ambulatory Visit: Payer: Self-pay

## 2021-01-21 DIAGNOSIS — I10 Essential (primary) hypertension: Secondary | ICD-10-CM | POA: Diagnosis present

## 2021-01-21 DIAGNOSIS — Z803 Family history of malignant neoplasm of breast: Secondary | ICD-10-CM

## 2021-01-21 DIAGNOSIS — K922 Gastrointestinal hemorrhage, unspecified: Secondary | ICD-10-CM

## 2021-01-21 DIAGNOSIS — D62 Acute posthemorrhagic anemia: Secondary | ICD-10-CM | POA: Diagnosis present

## 2021-01-21 DIAGNOSIS — I951 Orthostatic hypotension: Secondary | ICD-10-CM | POA: Diagnosis present

## 2021-01-21 DIAGNOSIS — D539 Nutritional anemia, unspecified: Secondary | ICD-10-CM

## 2021-01-21 DIAGNOSIS — Z79899 Other long term (current) drug therapy: Secondary | ICD-10-CM

## 2021-01-21 DIAGNOSIS — R112 Nausea with vomiting, unspecified: Secondary | ICD-10-CM

## 2021-01-21 DIAGNOSIS — Z8041 Family history of malignant neoplasm of ovary: Secondary | ICD-10-CM

## 2021-01-21 DIAGNOSIS — T39395A Adverse effect of other nonsteroidal anti-inflammatory drugs [NSAID], initial encounter: Secondary | ICD-10-CM | POA: Diagnosis present

## 2021-01-21 DIAGNOSIS — Z886 Allergy status to analgesic agent status: Secondary | ICD-10-CM

## 2021-01-21 DIAGNOSIS — K254 Chronic or unspecified gastric ulcer with hemorrhage: Secondary | ICD-10-CM | POA: Diagnosis present

## 2021-01-21 DIAGNOSIS — K264 Chronic or unspecified duodenal ulcer with hemorrhage: Principal | ICD-10-CM | POA: Diagnosis present

## 2021-01-21 DIAGNOSIS — R55 Syncope and collapse: Secondary | ICD-10-CM | POA: Diagnosis not present

## 2021-01-21 DIAGNOSIS — Z888 Allergy status to other drugs, medicaments and biological substances status: Secondary | ICD-10-CM

## 2021-01-21 DIAGNOSIS — E1165 Type 2 diabetes mellitus with hyperglycemia: Secondary | ICD-10-CM

## 2021-01-21 DIAGNOSIS — D649 Anemia, unspecified: Secondary | ICD-10-CM

## 2021-01-21 DIAGNOSIS — R197 Diarrhea, unspecified: Secondary | ICD-10-CM

## 2021-01-21 DIAGNOSIS — Z794 Long term (current) use of insulin: Secondary | ICD-10-CM

## 2021-01-21 DIAGNOSIS — E860 Dehydration: Secondary | ICD-10-CM | POA: Diagnosis present

## 2021-01-21 DIAGNOSIS — K219 Gastro-esophageal reflux disease without esophagitis: Secondary | ICD-10-CM | POA: Diagnosis present

## 2021-01-21 DIAGNOSIS — Z9641 Presence of insulin pump (external) (internal): Secondary | ICD-10-CM | POA: Diagnosis present

## 2021-01-21 DIAGNOSIS — Z833 Family history of diabetes mellitus: Secondary | ICD-10-CM

## 2021-01-21 DIAGNOSIS — U071 COVID-19: Secondary | ICD-10-CM | POA: Diagnosis present

## 2021-01-21 DIAGNOSIS — R739 Hyperglycemia, unspecified: Secondary | ICD-10-CM

## 2021-01-21 DIAGNOSIS — Z87891 Personal history of nicotine dependence: Secondary | ICD-10-CM

## 2021-01-21 DIAGNOSIS — K269 Duodenal ulcer, unspecified as acute or chronic, without hemorrhage or perforation: Secondary | ICD-10-CM

## 2021-01-21 DIAGNOSIS — Z7984 Long term (current) use of oral hypoglycemic drugs: Secondary | ICD-10-CM

## 2021-01-21 DIAGNOSIS — E876 Hypokalemia: Secondary | ICD-10-CM | POA: Diagnosis not present

## 2021-01-21 DIAGNOSIS — E8809 Other disorders of plasma-protein metabolism, not elsewhere classified: Secondary | ICD-10-CM | POA: Diagnosis present

## 2021-01-21 HISTORY — DX: Type 2 diabetes mellitus without complications: E11.9

## 2021-01-21 LAB — CBC WITH DIFFERENTIAL/PLATELET
Abs Immature Granulocytes: 0.07 10*3/uL (ref 0.00–0.07)
Basophils Absolute: 0 10*3/uL (ref 0.0–0.1)
Basophils Relative: 0 %
Eosinophils Absolute: 0 10*3/uL (ref 0.0–0.5)
Eosinophils Relative: 0 %
HCT: 23.2 % — ABNORMAL LOW (ref 36.0–46.0)
Hemoglobin: 7.4 g/dL — ABNORMAL LOW (ref 12.0–15.0)
Immature Granulocytes: 1 %
Lymphocytes Relative: 4 %
Lymphs Abs: 0.5 10*3/uL — ABNORMAL LOW (ref 0.7–4.0)
MCH: 33.5 pg (ref 26.0–34.0)
MCHC: 31.9 g/dL (ref 30.0–36.0)
MCV: 105 fL — ABNORMAL HIGH (ref 80.0–100.0)
Monocytes Absolute: 0.4 10*3/uL (ref 0.1–1.0)
Monocytes Relative: 4 %
Neutro Abs: 10.3 10*3/uL — ABNORMAL HIGH (ref 1.7–7.7)
Neutrophils Relative %: 91 %
Platelets: 146 10*3/uL — ABNORMAL LOW (ref 150–400)
RBC: 2.21 MIL/uL — ABNORMAL LOW (ref 3.87–5.11)
RDW: 12.4 % (ref 11.5–15.5)
WBC: 11.2 10*3/uL — ABNORMAL HIGH (ref 4.0–10.5)
nRBC: 0 % (ref 0.0–0.2)

## 2021-01-21 LAB — COMPREHENSIVE METABOLIC PANEL
ALT: 13 U/L (ref 0–44)
AST: 13 U/L — ABNORMAL LOW (ref 15–41)
Albumin: 2.6 g/dL — ABNORMAL LOW (ref 3.5–5.0)
Alkaline Phosphatase: 65 U/L (ref 38–126)
Anion gap: 10 (ref 5–15)
BUN: 26 mg/dL — ABNORMAL HIGH (ref 6–20)
CO2: 20 mmol/L — ABNORMAL LOW (ref 22–32)
Calcium: 7.5 mg/dL — ABNORMAL LOW (ref 8.9–10.3)
Chloride: 103 mmol/L (ref 98–111)
Creatinine, Ser: 0.8 mg/dL (ref 0.44–1.00)
GFR, Estimated: >60 mL/min (ref >60)
Glucose, Bld: 422 mg/dL — ABNORMAL HIGH (ref 70–99)
Potassium: 5.1 mmol/L (ref 3.5–5.1)
Sodium: 133 mmol/L — ABNORMAL LOW (ref 135–145)
Total Bilirubin: 0.7 mg/dL (ref 0.3–1.2)
Total Protein: 4.9 g/dL — ABNORMAL LOW (ref 6.5–8.1)

## 2021-01-21 LAB — CBG MONITORING, ED
Glucose-Capillary: 366 mg/dL — ABNORMAL HIGH (ref 70–99)
Glucose-Capillary: 341 mg/dL — ABNORMAL HIGH (ref 70–99)

## 2021-01-21 LAB — PROTIME-INR
INR: 1.2 (ref 0.8–1.2)
Prothrombin Time: 14.6 seconds (ref 11.4–15.2)

## 2021-01-21 LAB — TROPONIN I (HIGH SENSITIVITY)
Troponin I (High Sensitivity): 31 ng/L — ABNORMAL HIGH (ref <18)
Troponin I (High Sensitivity): 33 ng/L — ABNORMAL HIGH (ref <18)

## 2021-01-21 LAB — ABO/RH: ABO/RH(D): A POS

## 2021-01-21 LAB — POC OCCULT BLOOD, ED: Fecal Occult Bld: POSITIVE — AB

## 2021-01-21 LAB — PREPARE RBC (CROSSMATCH)

## 2021-01-21 MED ORDER — SODIUM CHLORIDE 0.9 % IV SOLN
80.0000 mg | Freq: Once | INTRAVENOUS | Status: AC
  Administered 2021-01-22: 80 mg via INTRAVENOUS
  Filled 2021-01-21: qty 80

## 2021-01-21 MED ORDER — SODIUM CHLORIDE 0.9 % IV SOLN
10.0000 mL/h | Freq: Once | INTRAVENOUS | Status: AC
  Administered 2021-01-22: 10 mL/h via INTRAVENOUS

## 2021-01-21 MED ORDER — SODIUM CHLORIDE 0.9 % IV BOLUS
1000.0000 mL | Freq: Once | INTRAVENOUS | Status: AC
  Administered 2021-01-22: 1000 mL via INTRAVENOUS

## 2021-01-21 MED ORDER — ONDANSETRON HCL 4 MG/2ML IJ SOLN
4.0000 mg | Freq: Once | INTRAMUSCULAR | Status: AC
  Administered 2021-01-22: 4 mg via INTRAVENOUS
  Filled 2021-01-21: qty 2

## 2021-01-21 MED ORDER — SODIUM CHLORIDE 0.9 % IV SOLN
8.0000 mg/h | INTRAVENOUS | Status: AC
  Administered 2021-01-22 – 2021-01-24 (×7): 8 mg/h via INTRAVENOUS
  Filled 2021-01-21 (×11): qty 80

## 2021-01-21 MED ORDER — SODIUM CHLORIDE 0.9 % IV BOLUS
1000.0000 mL | Freq: Once | INTRAVENOUS | Status: AC
  Administered 2021-01-21: 1000 mL via INTRAVENOUS

## 2021-01-21 MED ORDER — SODIUM CHLORIDE 0.9 % IV SOLN: INTRAVENOUS | Status: DC

## 2021-01-21 NOTE — ED Provider Notes (Signed)
[facility] EMERGENCY DEPARTMENT Provider Note   CSN: [id number] Arrival date & time: 01/21/21  2059     History Chief Complaint  Patient presents with   Loss of Consciousness    Laurie Larson is a 60 y.o. female.  Patient with type 1 diabetes with a pump.  She is here after a syncopal episode, generalized weakness, nausea, vomiting and diarrhea.  States she is been feeling ill since about 2 PM.  States her sugars been running high all day and she has been bolusing herself appropriately.  She has had increased generalized weakness, fatigue, dizziness and lightheadedness.  She had one episode of nonbilious nonbloody emesis.  She had several episodes of dark bloody stools today.  Her husband states while she was sitting on the toilet she lost consciousness for several seconds and he thinks that she had a seizure and was shaking all over.  She did not fall or hit her head.  She was slow to respond after this but is now back to baseline.  She denies any headache, neck pain, chest pain, abdominal pain.  No fever. States she has been having intermittent bloody stools for quite some time.  Has had a colonoscopy in the past with polyps but never had an EGD.  No blood thinner use. Husband is confident she did not hit her head.  She feels generally weak and lightheaded.  She denies any pain.  The history is provided by the patient, the EMS personnel and a relative.  Loss of Consciousness Associated symptoms: dizziness, nausea, vomiting and weakness   Associated symptoms: no chest pain, no fever and no shortness of breath        Past Medical History:  Diagnosis Date   Benign tumor of breast    removed   Depressive episode    rersolved   Diabetes mellitus without complication ([place])    Diarrhea    GI Outlaw   History of stomach ulcers    Hypertension    Rectal bleeding     Patient Active Problem List   Diagnosis Date Noted   Essential hypertension 12/02/2017    Hypokalemia 12/02/2017   Tobacco abuse counseling 12/02/2017   DKA, type 2 ([place]) 12/02/2017   DKA (diabetic ketoacidoses) 12/01/2017   Adrenal incidentaloma ([place]) 01/31/2015    Past Surgical History:  Procedure Laterality Date   ABDOMINAL HYSTERECTOMY     Total   AUGMENTATION MAMMAPLASTY     BREAST ENHANCEMENT SURGERY     BREAST EXCISIONAL BIOPSY     BREAST SURGERY     benign right breast   COLONOSCOPY  12/2012   polyps benign, diverticulosis, hemorrhoids, recheck in 3 years   ESOPHAGOGASTRODUODENOSCOPY     normal esophagus,gastritis, duodenal erosions without bleeding, bx benign     OB History   No obstetric history on file.     Family History  Problem Relation Age of Onset   Ovarian cancer Maternal Aunt        deceased   Juvenile Diabetes Maternal Aunt    Breast cancer Maternal Aunt    Cancer Maternal Grandmother        type unknown   Breast cancer Sister    Breast cancer Mother        deceased    Social History   Tobacco Use   Smoking status: Current Every Day Smoker    Packs/day: 0.25    Types: Cigarettes   Smokeless tobacco: Never Used  Vaping Use   Vaping Use: Never used  Substance Use Topics   Alcohol use: Yes    Alcohol/week: 7.0 standard drinks    Types: 7 Glasses of wine per week    Comment: moderate   Drug use: No    Home Medications Prior to Admission medications   Medication Sig Start Date End Date Taking? Authorizing Provider  blood glucose meter kit and supplies Dispense based on patient and insurance preference. Use up to four times daily as directed. (FOR ICD-10 E10.9, E11.9). 12/02/17   Dhungel, Flonnie Overman, MD  Insulin Detemir (LEVEMIR) 100 UNIT/ML Pen Inject 15 Units into the skin daily at 10 pm. 12/02/17   Dhungel, Flonnie Overman, MD  Insulin Pen Needle 31G X 5 MM MISC 1 application by Does not apply route at bedtime. 12/02/17   Dhungel, Flonnie Overman, MD  losartan (COZAAR) 100 MG tablet Take 1 tablet (100 mg total) by mouth daily.  12/02/17 12/02/18  Dhungel, Flonnie Overman, MD  metFORMIN (GLUCOPHAGE) 500 MG tablet Take 1 tablet (500 mg total) by mouth 2 (two) times daily with a meal. 12/02/17 12/02/18  Dhungel, Nishant, MD  Multiple Vitamin (MULTIVITAMIN WITH MINERALS) TABS tablet Take 1 tablet by mouth daily.    [provider]  omeprazole (PRILOSEC) 20 MG capsule Take 1 capsule by mouth daily. 09/14/17   [provider]    Allergies    Acetaminophen, Lisinopril, and Norvasc [amlodipine besylate]  Review of Systems   Review of Systems  Constitutional: Positive for activity change, appetite change and fatigue. Negative for fever.  HENT: Negative for congestion and rhinorrhea.   Eyes: Negative for visual disturbance.  Respiratory: Negative for cough, chest tightness and shortness of breath.   Cardiovascular: Positive for syncope. Negative for chest pain.  Gastrointestinal: Positive for blood in stool, diarrhea, nausea and vomiting. Negative for abdominal pain.  Genitourinary: Negative for dysuria and hematuria.  Musculoskeletal: Negative for arthralgias.  Skin: Negative for rash.  Neurological: Positive for dizziness, syncope, weakness and light-headedness.   all other systems are negative except as noted in the HPI and PMH.    Physical Exam Updated Vital Signs BP 135/86    Pulse 98    Resp 18    Ht $R'5\' 5"'aD$  (1.651 m)    Wt 57.2 kg    SpO2 100%    BMI 20.97 kg/m   Physical Exam Vitals and nursing note reviewed.  Constitutional:      General: She is not in acute distress.    Appearance: She is well-developed.     Comments: Pale appearing  HENT:     Head: Normocephalic and atraumatic.     Mouth/Throat:     Pharynx: No oropharyngeal exudate.  Eyes:     Conjunctiva/sclera: Conjunctivae normal.     Pupils: Pupils are equal, round, and reactive to light.  Neck:     Comments: No meningismus. Cardiovascular:     Rate and Rhythm: Normal rate and regular rhythm.     Heart sounds: Normal heart sounds. No  murmur heard.   Pulmonary:     Effort: Pulmonary effort is normal. No respiratory distress.     Breath sounds: Normal breath sounds.  Abdominal:     Palpations: Abdomen is soft.     Tenderness: There is no abdominal tenderness. There is no guarding or rebound.  Genitourinary:    Comments: Chaperone present dark maroon stool present guaiac positive Musculoskeletal:        General: No tenderness. Normal range of motion.     Cervical back: Normal range of motion and neck  supple.  Skin:    General: Skin is warm.  Neurological:     Mental Status: She is alert and oriented to person, place, and time.     Cranial Nerves: No cranial nerve deficit.     Motor: No abnormal muscle tone.     Coordination: Coordination normal.     Comments:  5/5 strength throughout. CN 2-12 intact.Equal grip strength.   Psychiatric:        Behavior: Behavior normal.     ED Results / Procedures / Treatments   Labs (all labs ordered are listed, but only abnormal results are displayed) Labs Reviewed  CBC WITH DIFFERENTIAL/PLATELET - Abnormal; Notable for the following components:      Result Value   WBC 11.2 (*)    RBC 2.21 (*)    Hemoglobin 7.4 (*)    HCT 23.2 (*)    MCV 105.0 (*)    Platelets 146 (*)    Neutro Abs 10.3 (*)    Lymphs Abs 0.5 (*)    All other components within normal limits  COMPREHENSIVE METABOLIC PANEL - Abnormal; Notable for the following components:   Sodium 133 (*)    CO2 20 (*)    Glucose, Bld 422 (*)    BUN 26 (*)    Calcium 7.5 (*)    Total Protein 4.9 (*)    Albumin 2.6 (*)    AST 13 (*)    All other components within normal limits  CBG MONITORING, ED - Abnormal; Notable for the following components:   Glucose-Capillary 366 (*)    All other components within normal limits  CBG MONITORING, ED - Abnormal; Notable for the following components:   Glucose-Capillary 341 (*)    All other components within normal limits  POC OCCULT BLOOD, ED - Abnormal; Notable for the  following components:   Fecal Occult Bld POSITIVE (*)    All other components within normal limits  TROPONIN I (HIGH SENSITIVITY) - Abnormal; Notable for the following components:   Troponin I (High Sensitivity) 31 (*)    All other components within normal limits  URINALYSIS, ROUTINE W REFLEX MICROSCOPIC  PROTIME-INR  I-STAT BETA HCG BLOOD, ED ([facility], [facility], [facility] ONLY)  POC URINE PREG, ED  TYPE AND SCREEN  PREPARE RBC (CROSSMATCH)  TROPONIN I (HIGH SENSITIVITY)    EKG EKG Interpretation  Date/Time:  Saturday January 21 2021 21:58:42 EST Ventricular Rate:  98 PR Interval:    QRS Duration: 89 QT Interval:  350 QTC Calculation: 447 R Axis:   66 Text Interpretation: Sinus rhythm No significant change was found No significant change was found Reconfirmed by Ezequiel Essex [phone]) on 01/21/2021 11:25:08 PM   Radiology CT Head Wo Contrast  Result Date: 01/21/2021 CLINICAL DATA:  Syncope. EXAM: CT HEAD WITHOUT CONTRAST TECHNIQUE: Contiguous axial images were obtained from the base of the skull through the vertex without intravenous contrast. COMPARISON:  December 01, 2017 FINDINGS: Brain: There is mild cerebral atrophy with widening of the extra-axial spaces and ventricular dilatation. There are areas of decreased attenuation within the white matter tracts of the supratentorial brain, consistent with microvascular disease changes. Vascular: No hyperdense vessel or unexpected calcification. Skull: Normal. Negative for fracture or focal lesion. Sinuses/Orbits: No acute finding. Other: None. IMPRESSION: 1. Generalized cerebral atrophy. 2. No acute intracranial abnormality. Electronically Signed   By: Virgina Norfolk M.D.   On: 01/21/2021 23:53    Procedures .Critical Care Performed by: Ezequiel Essex, MD Authorized by: Ezequiel Essex, MD  Critical care provider statement:    Critical care time (minutes):  45   Critical care was necessary to treat or prevent imminent or life-threatening  deterioration of the following conditions:  Shock (GI bleed, syncope, hyperglycemia)   Critical care was time spent personally by me on the following activities:  Discussions with consultants, evaluation of patient's response to treatment, examination of patient, ordering and performing treatments and interventions, ordering and review of laboratory studies, ordering and review of radiographic studies, pulse oximetry, re-evaluation of patient's condition, obtaining history from patient or surrogate and review of old charts     Medications Ordered in ED Medications  sodium chloride 0.9 % bolus 1,000 mL (has no administration in time range)    And  0.9 %  sodium chloride infusion (has no administration in time range)  pantoprazole (PROTONIX) 80 mg in sodium chloride 0.9 % 100 mL IVPB (has no administration in time range)  pantoprazole (PROTONIX) 80 mg in sodium chloride 0.9 % 100 mL (0.8 mg/mL) infusion (has no administration in time range)  ondansetron (ZOFRAN) injection 4 mg (has no administration in time range)  0.9 %  sodium chloride infusion (has no administration in time range)  sodium chloride 0.9 % bolus 1,000 mL (1,000 mLs Intravenous New Bag/Given 01/21/21 2159)    ED Course  I have reviewed the triage vital signs and the nursing notes.  Pertinent labs & imaging results that were available during my care of the patient were reviewed by me and considered in my medical decision making (see chart for details).    MDM Rules/Calculators/A&P                         Type I diabetic with syncopal episode preceded by dizziness, lightheadedness and fatigue.  She has had some intermittent bloody stools as well.  Vital stable, no distress.  She is Hemoccult positive.  Hemoglobin has dropped 7.5 from 12.5 in 2019. EKG is sinus rhythm with no prolonged QT or no Brugada.  Hyperglycemia without DKA  Orthostatic by heart rate.  Heart rate elevates to 133 when standing.  Patient was given IV  hydration will be transfused red blood cells. Suspect her syncope likely due to dehydration and active GI bleeding. IV PPI started with blood transfusion.  CT head is negative.  Suspect episode of convulsive syncope rather than true seizure.  She is agreeable to overnight observation for blood transfusion in setting of GI bleed causing syncope.  Admission discussed with Dr. Josephine Cables.  We will continue her insulin pump for now and monitor sugars closely.  She is not in DKA.  She was hydrated and blood transfusion is ordered.  Final Clinical Impression(s) / ED Diagnoses Final diagnoses:  Syncope and collapse  Gastrointestinal hemorrhage, unspecified gastrointestinal hemorrhage type  Hyperglycemia    Rx / DC Orders ED Discharge Orders    None       Atziri Zubiate, Annie Main, MD 01/22/21 0150

## 2021-01-21 NOTE — ED Triage Notes (Addendum)
[facility] - pt from home, pt had witnessed syncopal episode while walking to the bathroom. Pt was hypotensive and hyperglycemic with EMS, BP 01K systolic, cbg 553. Pt has an insulin pump and censor. Pt A&O upon arrival.

## 2021-01-22 ENCOUNTER — Inpatient Hospital Stay (HOSPITAL_COMMUNITY): Payer: 59 | Admitting: Anesthesiology

## 2021-01-22 ENCOUNTER — Encounter (HOSPITAL_COMMUNITY): Payer: Self-pay | Admitting: Internal Medicine

## 2021-01-22 ENCOUNTER — Encounter (HOSPITAL_COMMUNITY)
Admission: EM | Discharge status: Against Medical Advice | Payer: Self-pay | Source: Home / Self Care | Attending: Internal Medicine

## 2021-01-22 ENCOUNTER — Inpatient Hospital Stay (HOSPITAL_COMMUNITY): Payer: 59

## 2021-01-22 DIAGNOSIS — R197 Diarrhea, unspecified: Secondary | ICD-10-CM

## 2021-01-22 DIAGNOSIS — K219 Gastro-esophageal reflux disease without esophagitis: Secondary | ICD-10-CM | POA: Diagnosis present

## 2021-01-22 DIAGNOSIS — K922 Gastrointestinal hemorrhage, unspecified: Secondary | ICD-10-CM | POA: Diagnosis not present

## 2021-01-22 DIAGNOSIS — U071 COVID-19: Secondary | ICD-10-CM

## 2021-01-22 DIAGNOSIS — I10 Essential (primary) hypertension: Secondary | ICD-10-CM | POA: Diagnosis present

## 2021-01-22 DIAGNOSIS — K269 Duodenal ulcer, unspecified as acute or chronic, without hemorrhage or perforation: Secondary | ICD-10-CM | POA: Diagnosis not present

## 2021-01-22 DIAGNOSIS — E8809 Other disorders of plasma-protein metabolism, not elsewhere classified: Secondary | ICD-10-CM

## 2021-01-22 DIAGNOSIS — Z87891 Personal history of nicotine dependence: Secondary | ICD-10-CM | POA: Diagnosis not present

## 2021-01-22 DIAGNOSIS — Z886 Allergy status to analgesic agent status: Secondary | ICD-10-CM | POA: Diagnosis not present

## 2021-01-22 DIAGNOSIS — I951 Orthostatic hypotension: Secondary | ICD-10-CM

## 2021-01-22 DIAGNOSIS — R112 Nausea with vomiting, unspecified: Secondary | ICD-10-CM

## 2021-01-22 DIAGNOSIS — Z803 Family history of malignant neoplasm of breast: Secondary | ICD-10-CM | POA: Diagnosis not present

## 2021-01-22 DIAGNOSIS — R55 Syncope and collapse: Secondary | ICD-10-CM | POA: Diagnosis present

## 2021-01-22 DIAGNOSIS — E1165 Type 2 diabetes mellitus with hyperglycemia: Secondary | ICD-10-CM

## 2021-01-22 DIAGNOSIS — K264 Chronic or unspecified duodenal ulcer with hemorrhage: Secondary | ICD-10-CM | POA: Diagnosis present

## 2021-01-22 DIAGNOSIS — E860 Dehydration: Secondary | ICD-10-CM | POA: Diagnosis present

## 2021-01-22 DIAGNOSIS — Z79899 Other long term (current) drug therapy: Secondary | ICD-10-CM | POA: Diagnosis not present

## 2021-01-22 DIAGNOSIS — Z8041 Family history of malignant neoplasm of ovary: Secondary | ICD-10-CM | POA: Diagnosis not present

## 2021-01-22 DIAGNOSIS — T39395A Adverse effect of other nonsteroidal anti-inflammatory drugs [NSAID], initial encounter: Secondary | ICD-10-CM | POA: Diagnosis present

## 2021-01-22 DIAGNOSIS — Z888 Allergy status to other drugs, medicaments and biological substances status: Secondary | ICD-10-CM | POA: Diagnosis not present

## 2021-01-22 DIAGNOSIS — D62 Acute posthemorrhagic anemia: Secondary | ICD-10-CM | POA: Diagnosis present

## 2021-01-22 DIAGNOSIS — E1065 Type 1 diabetes mellitus with hyperglycemia: Secondary | ICD-10-CM | POA: Insufficient documentation

## 2021-01-22 DIAGNOSIS — E876 Hypokalemia: Secondary | ICD-10-CM | POA: Diagnosis not present

## 2021-01-22 DIAGNOSIS — Z9641 Presence of insulin pump (external) (internal): Secondary | ICD-10-CM | POA: Diagnosis present

## 2021-01-22 DIAGNOSIS — K254 Chronic or unspecified gastric ulcer with hemorrhage: Secondary | ICD-10-CM | POA: Diagnosis present

## 2021-01-22 DIAGNOSIS — Z794 Long term (current) use of insulin: Secondary | ICD-10-CM | POA: Diagnosis not present

## 2021-01-22 DIAGNOSIS — Z833 Family history of diabetes mellitus: Secondary | ICD-10-CM | POA: Diagnosis not present

## 2021-01-22 DIAGNOSIS — D649 Anemia, unspecified: Secondary | ICD-10-CM

## 2021-01-22 DIAGNOSIS — D539 Nutritional anemia, unspecified: Secondary | ICD-10-CM

## 2021-01-22 DIAGNOSIS — K3189 Other diseases of stomach and duodenum: Secondary | ICD-10-CM | POA: Diagnosis not present

## 2021-01-22 DIAGNOSIS — Z7984 Long term (current) use of oral hypoglycemic drugs: Secondary | ICD-10-CM | POA: Diagnosis not present

## 2021-01-22 HISTORY — PX: ESOPHAGOGASTRODUODENOSCOPY (EGD) WITH PROPOFOL: SHX5813

## 2021-01-22 HISTORY — DX: Anemia, unspecified: D64.9

## 2021-01-22 LAB — CBC
HCT: 26.1 % — ABNORMAL LOW (ref 36.0–46.0)
Hemoglobin: 8.7 g/dL — ABNORMAL LOW (ref 12.0–15.0)
MCH: 32.6 pg (ref 26.0–34.0)
MCHC: 33.3 g/dL (ref 30.0–36.0)
MCV: 97.8 fL (ref 80.0–100.0)
Platelets: 118 10*3/uL — ABNORMAL LOW (ref 150–400)
RBC: 2.67 MIL/uL — ABNORMAL LOW (ref 3.87–5.11)
RDW: 15.3 % (ref 11.5–15.5)
WBC: 8.5 10*3/uL (ref 4.0–10.5)
nRBC: 0 % (ref 0.0–0.2)

## 2021-01-22 LAB — GLUCOSE, CAPILLARY
Glucose-Capillary: 245 mg/dL — ABNORMAL HIGH (ref 70–99)
Glucose-Capillary: 203 mg/dL — ABNORMAL HIGH (ref 70–99)
Glucose-Capillary: 170 mg/dL — ABNORMAL HIGH (ref 70–99)

## 2021-01-22 LAB — PHOSPHORUS: Phosphorus: 2.4 mg/dL — ABNORMAL LOW (ref 2.5–4.6)

## 2021-01-22 LAB — URINALYSIS, ROUTINE W REFLEX MICROSCOPIC
Bacteria, UA: NONE SEEN
Bilirubin Urine: NEGATIVE
Glucose, UA: >=500 mg/dL — AB
Hgb urine dipstick: NEGATIVE
Ketones, ur: 20 mg/dL — AB
Leukocytes,Ua: NEGATIVE
Nitrite: NEGATIVE
Protein, ur: NEGATIVE mg/dL
Specific Gravity, Urine: 1.018 (ref 1.005–1.030)
pH: 5 (ref 5.0–8.0)

## 2021-01-22 LAB — ECHOCARDIOGRAM LIMITED
AV Peak grad: 13.8 mmHg
Ao pk vel: 1.86 m/s
Area-P 1/2: 5.38 cm2
Height: 65 in
S' Lateral: 2.79 cm
Weight: 2257.51 oz

## 2021-01-22 LAB — COMPREHENSIVE METABOLIC PANEL
ALT: 12 U/L (ref 0–44)
AST: 15 U/L (ref 15–41)
Albumin: 2.5 g/dL — ABNORMAL LOW (ref 3.5–5.0)
Alkaline Phosphatase: 56 U/L (ref 38–126)
Anion gap: 8 (ref 5–15)
BUN: 16 mg/dL (ref 6–20)
CO2: 20 mmol/L — ABNORMAL LOW (ref 22–32)
Calcium: 7.5 mg/dL — ABNORMAL LOW (ref 8.9–10.3)
Chloride: 108 mmol/L (ref 98–111)
Creatinine, Ser: 0.64 mg/dL (ref 0.44–1.00)
GFR, Estimated: >60 mL/min (ref >60)
Glucose, Bld: 274 mg/dL — ABNORMAL HIGH (ref 70–99)
Potassium: 3.7 mmol/L (ref 3.5–5.1)
Sodium: 136 mmol/L (ref 135–145)
Total Bilirubin: 0.9 mg/dL (ref 0.3–1.2)
Total Protein: 4.6 g/dL — ABNORMAL LOW (ref 6.5–8.1)

## 2021-01-22 LAB — APTT: aPTT: 26 seconds (ref 24–36)

## 2021-01-22 LAB — RESP PANEL BY RT-PCR (FLU A&B, COVID) ARPGX2
Influenza A by PCR: NEGATIVE
Influenza B by PCR: NEGATIVE
SARS Coronavirus 2 by RT PCR: POSITIVE — AB

## 2021-01-22 LAB — CBG MONITORING, ED: Glucose-Capillary: 324 mg/dL — ABNORMAL HIGH (ref 70–99)

## 2021-01-22 LAB — MAGNESIUM: Magnesium: 1.4 mg/dL — ABNORMAL LOW (ref 1.7–2.4)

## 2021-01-22 LAB — HEMOGLOBIN A1C
Hgb A1c MFr Bld: 6.1 % — ABNORMAL HIGH (ref 4.8–5.6)
Mean Plasma Glucose: 128.37 mg/dL

## 2021-01-22 LAB — I-STAT BETA HCG BLOOD, ED (MC, WL, AP ONLY): I-stat hCG, quantitative: <5 m[IU]/mL (ref <5)

## 2021-01-22 LAB — MRSA PCR SCREENING: MRSA by PCR: NEGATIVE

## 2021-01-22 LAB — FOLATE: Folate: 16.1 ng/mL (ref >5.9)

## 2021-01-22 LAB — VITAMIN B12: Vitamin B-12: 483 pg/mL (ref 180–914)

## 2021-01-22 LAB — TROPONIN I (HIGH SENSITIVITY): Troponin I (High Sensitivity): 25 ng/L — ABNORMAL HIGH (ref <18)

## 2021-01-22 LAB — HIV ANTIBODY (ROUTINE TESTING W REFLEX): HIV Screen 4th Generation wRfx: NONREACTIVE

## 2021-01-22 SURGERY — ESOPHAGOGASTRODUODENOSCOPY (EGD) WITH PROPOFOL
Anesthesia: General

## 2021-01-22 MED ORDER — INSULIN ASPART 100 UNIT/ML ~~LOC~~ SOLN: 0.0000 [IU] | Freq: Every day | SUBCUTANEOUS | Status: DC

## 2021-01-22 MED ORDER — PROPOFOL 10 MG/ML IV BOLUS
INTRAVENOUS | Status: AC
  Filled 2021-01-22: qty 40

## 2021-01-22 MED ORDER — INSULIN ASPART 100 UNIT/ML ~~LOC~~ SOLN
0.0000 [IU] | Freq: Three times a day (TID) | SUBCUTANEOUS | Status: DC
  Administered 2021-01-22: 3.8 [IU] via SUBCUTANEOUS
  Administered 2021-01-22: 2.3 [IU] via SUBCUTANEOUS

## 2021-01-22 MED ORDER — ZINC SULFATE 220 (50 ZN) MG PO CAPS
220.0000 mg | ORAL_CAPSULE | Freq: Every day | ORAL | Status: DC
  Administered 2021-01-22 – 2021-01-24 (×3): 220 mg via ORAL
  Filled 2021-01-22 (×4): qty 1

## 2021-01-22 MED ORDER — METOCLOPRAMIDE HCL 5 MG/ML IJ SOLN
10.0000 mg | Freq: Once | INTRAMUSCULAR | Status: AC
  Administered 2021-01-22: 10 mg via INTRAVENOUS
  Filled 2021-01-22: qty 2

## 2021-01-22 MED ORDER — LACTATED RINGERS IV SOLN: INTRAVENOUS | Status: DC | PRN

## 2021-01-22 MED ORDER — PROPOFOL 500 MG/50ML IV EMUL
INTRAVENOUS | Status: DC | PRN
  Administered 2021-01-22: 150 ug/kg/min via INTRAVENOUS

## 2021-01-22 MED ORDER — HYDROCORTISONE 1 % EX CREA
TOPICAL_CREAM | Freq: Two times a day (BID) | CUTANEOUS | Status: DC
  Filled 2021-01-22: qty 28

## 2021-01-22 MED ORDER — HYDRALAZINE HCL 20 MG/ML IJ SOLN
INTRAMUSCULAR | Status: DC | PRN
  Administered 2021-01-22: 5 mg via INTRAVENOUS

## 2021-01-22 MED ORDER — STERILE WATER FOR IRRIGATION IR SOLN
Status: DC | PRN
  Administered 2021-01-22: 200 mL

## 2021-01-22 MED ORDER — AMLODIPINE BESYLATE 5 MG PO TABS
2.5000 mg | ORAL_TABLET | Freq: Every day | ORAL | Status: DC
  Administered 2021-01-22 – 2021-01-24 (×3): 2.5 mg via ORAL
  Filled 2021-01-22 (×4): qty 1

## 2021-01-22 MED ORDER — PROPOFOL 10 MG/ML IV BOLUS
INTRAVENOUS | Status: DC | PRN
  Administered 2021-01-22: 50 mg via INTRAVENOUS
  Administered 2021-01-22: 100 mg via INTRAVENOUS
  Administered 2021-01-22: 50 mg via INTRAVENOUS
  Administered 2021-01-22: 20 mg via INTRAVENOUS
  Administered 2021-01-22: 50 mg via INTRAVENOUS
  Administered 2021-01-22: 30 mg via INTRAVENOUS

## 2021-01-22 MED ORDER — GLUCERNA SHAKE PO LIQD: 237.0000 mL | Freq: Three times a day (TID) | ORAL | Status: DC

## 2021-01-22 MED ORDER — FENTANYL CITRATE (PF) 100 MCG/2ML IJ SOLN
INTRAMUSCULAR | Status: DC | PRN
  Administered 2021-01-22: 25 ug via INTRAVENOUS
  Administered 2021-01-22: 50 ug via INTRAVENOUS
  Administered 2021-01-22: 25 ug via INTRAVENOUS

## 2021-01-22 MED ORDER — SODIUM CHLORIDE (PF) 0.9 % IJ SOLN
PREFILLED_SYRINGE | INTRAMUSCULAR | Status: DC | PRN
  Administered 2021-01-22: 5 mL

## 2021-01-22 MED ORDER — ONDANSETRON HCL 4 MG/2ML IJ SOLN: 4.0000 mg | Freq: Four times a day (QID) | INTRAMUSCULAR | Status: DC | PRN

## 2021-01-22 MED ORDER — LABETALOL HCL 5 MG/ML IV SOLN
INTRAVENOUS | Status: DC | PRN
  Administered 2021-01-22 (×2): 5 mg via INTRAVENOUS

## 2021-01-22 MED ORDER — ASCORBIC ACID 500 MG PO TABS
500.0000 mg | ORAL_TABLET | Freq: Every day | ORAL | Status: DC
  Administered 2021-01-22 – 2021-01-24 (×3): 500 mg via ORAL
  Filled 2021-01-22 (×4): qty 1

## 2021-01-22 MED ORDER — CHLORHEXIDINE GLUCONATE CLOTH 2 % EX PADS
6.0000 | MEDICATED_PAD | Freq: Every day | CUTANEOUS | Status: DC
  Administered 2021-01-22 – 2021-01-24 (×3): 6 via TOPICAL

## 2021-01-22 MED ORDER — LIDOCAINE HCL (PF) 2 % IJ SOLN
INTRAMUSCULAR | Status: AC
  Filled 2021-01-22: qty 5

## 2021-01-22 MED ORDER — HYDROCORTISONE (PERIANAL) 2.5 % EX CREA
TOPICAL_CREAM | Freq: Two times a day (BID) | CUTANEOUS | Status: DC
  Administered 2021-01-24: 1 via RECTAL
  Filled 2021-01-22: qty 28.35

## 2021-01-22 MED ORDER — LIDOCAINE HCL (CARDIAC) PF 100 MG/5ML IV SOSY
PREFILLED_SYRINGE | INTRAVENOUS | Status: DC | PRN
  Administered 2021-01-22: 100 mg via INTRAVENOUS

## 2021-01-22 MED ORDER — LOSARTAN POTASSIUM 50 MG PO TABS
100.0000 mg | ORAL_TABLET | Freq: Every day | ORAL | Status: DC
  Administered 2021-01-22 – 2021-01-24 (×3): 100 mg via ORAL
  Filled 2021-01-22 (×4): qty 2

## 2021-01-22 MED ORDER — FENTANYL CITRATE (PF) 100 MCG/2ML IJ SOLN
INTRAMUSCULAR | Status: AC
  Filled 2021-01-22: qty 2

## 2021-01-22 NOTE — Consult Note (Signed)
Maylon Peppers, M.D. Gastroenterology & Hepatology                                           Patient Name: Laurie Larson Account #: _0 @   MRN: [id number] Admission Date: 01/21/2021 Date of Evaluation:  01/22/2021 Time of Evaluation: 9:15 AM   Referring Physician: Bernadette Hoit, Md  Chief Complaint:  Melena, orthostatic hypotension  HPI:  This is a 60 y.o. female with history of type 1 diabetes, depression, hypertension and hemorrhoids, who came to the hospital after presenting episodes of vomiting, melena and lightheadedness.  Patient reports that yesterday she was feeling nauseated and vomited multiple times, which started suddenly as she was feeling well in the days prior.  She also presented blurred vision and decided to go to the restroom to have a bowel movement.  She noticed that she was feeling very weak while having a bowel movement, she noticed that her stools were black and tarry.  She had a total of 4 of these bowel movements.  After having these bowel movements she felt lightheaded almost like "she was going to pass out".  The patient denies having similar symptoms in the past.  She only reported having a sensation of a mass in her anal area last year with intermittent episodes of rectal bleeding very occasionally but not recently.  Patient denied having any abdominal pain, fever, chills, changes in her weight.  Patient never had an EGD in the past.  Her last colonoscopy was performed February 2014, per notes it states that she had benign polyps but no results of pathology or number of polyps is available.  She was advised to have a repeat colonoscopy in 3 years.  In the ED, she was presenting normal blood pressure 119/83 with a heart rate of 83 but she had orthostatic hypotension as her heart rate was 118 while sitting and 133 when standing, with a drop in her blood pressure to 104/79.  She was afebrile. Labs were remarkable for significant drop in hemoglobin to 7.4  (Baseline hemoglobin was 12.42 years ago), although cell count was 11.2, platelets were 146, also her BUN was elevated 26, with creatinine of 0.8, potassium was borderline 5.1 with sodium of 133, rest of electrolytes were within normal limits, normal liver function tests.  Patient had positive FOBT.  She was found to have incidentally COVID-19.  Patient denies having any symptoms but she had upper respiratory symptoms almost 3 months ago when her husband was diagnosed with COVID-19.  Patient was transfused 2 units PRBC, no repeat hemoglobin has been obtained so far.  Patient reports that she has been taking chronically ibuprofen 1-2 times a week for episodes of headache which is usually related to episodes of hyperglycemia.  Denies intake of anticoagulants, high dose aspirin, or any other antiplatelet.   FHx: neg for any gastrointestinal/liver disease, mother and sister diagnosed with breast cancer, had an aunt with ovarian cancer Social: Former smoking quit in September 2021, denies frequent alcohol use or illicit drug use Surgical: Hysterectomy  Past Medical History: SEE CHRONIC ISSSUES: Past Medical History:  Diagnosis Date  . Benign tumor of breast    removed  . Depressive episode    rersolved  . Diabetes mellitus without complication ([place])   . Diarrhea    GI Outlaw  . History of stomach ulcers   . Hypertension   . Rectal  bleeding   . Symptomatic anemia 01/22/2021   Past Surgical History:  Past Surgical History:  Procedure Laterality Date  . ABDOMINAL HYSTERECTOMY     Total  . AUGMENTATION MAMMAPLASTY    . BREAST ENHANCEMENT SURGERY    . BREAST EXCISIONAL BIOPSY    . BREAST SURGERY     benign right breast  . COLONOSCOPY  12/2012   polyps benign, diverticulosis, hemorrhoids, recheck in 3 years  . ESOPHAGOGASTRODUODENOSCOPY     normal esophagus,gastritis, duodenal erosions without bleeding, bx benign   Family History:  Family History  Problem Relation Age of Onset  . Ovarian  cancer Maternal Aunt        deceased  . Juvenile Diabetes Maternal Aunt   . Breast cancer Maternal Aunt   . Cancer Maternal Grandmother        type unknown  . Breast cancer Sister   . Breast cancer Mother        deceased   Social History:  Social History   Tobacco Use  . Smoking status: Former Smoker    Packs/day: 0.50    Years: 15.00    Pack years: 7.50    Types: Cigarettes    Quit date: 08/02/2020    Years since quitting: 0.4  . Smokeless tobacco: Never Used  Vaping Use  . Vaping Use: Never used  Substance Use Topics  . Alcohol use: Yes    Alcohol/week: 5.0 standard drinks    Types: 5 Glasses of wine per week    Comment: moderate  . Drug use: No    Home Medications:  Prior to Admission medications   Medication Sig Start Date End Date Taking? Authorizing Provider  Multiple Vitamin (MULTIVITAMIN WITH MINERALS) TABS tablet Take 1 tablet by mouth daily.   Yes [provider]  blood glucose meter kit and supplies Dispense based on patient and insurance preference. Use up to four times daily as directed. (FOR ICD-10 E10.9, E11.9). 12/02/17   Dhungel, Flonnie Overman, MD  Insulin Detemir (LEVEMIR) 100 UNIT/ML Pen Inject 15 Units into the skin daily at 10 pm. 12/02/17   Dhungel, Flonnie Overman, MD  Insulin Pen Needle 31G X 5 MM MISC 1 application by Does not apply route at bedtime. 12/02/17   Dhungel, Flonnie Overman, MD  losartan (COZAAR) 100 MG tablet Take 1 tablet (100 mg total) by mouth daily. 12/02/17 12/02/18  Dhungel, Flonnie Overman, MD  metFORMIN (GLUCOPHAGE) 500 MG tablet Take 1 tablet (500 mg total) by mouth 2 (two) times daily with a meal. 12/02/17 12/02/18  Dhungel, Nishant, MD  omeprazole (PRILOSEC) 20 MG capsule Take 1 capsule by mouth daily. 09/14/17   [provider]    Inpatient Medications:  Current Facility-Administered Medications:  .  [COMPLETED] sodium chloride 0.9 % bolus 1,000 mL, 1,000 mL, Intravenous, Once, Stopped at 01/22/21 0118 **AND** 0.9 %  sodium chloride infusion,  , Intravenous, Continuous, Adefeso, Oladapo, DO, Last Rate: 125 mL/hr at 01/22/21 0844, New Bag at 01/22/21 0844 .  ascorbic acid (VITAMIN C) tablet 500 mg, 500 mg, Oral, Daily, Adefeso, Oladapo, DO, 500 mg at 01/22/21 0821 .  Chlorhexidine Gluconate Cloth 2 % PADS 6 each, 6 each, Topical, Daily, Adefeso, Oladapo, DO, 6 each at 01/22/21 0825 .  feeding supplement (GLUCERNA SHAKE) (GLUCERNA SHAKE) liquid 237 mL, 237 mL, Oral, TID BM, Adefeso, Oladapo, DO .  ondansetron (ZOFRAN) injection 4 mg, 4 mg, Intravenous, Q6H PRN, Adefeso, Oladapo, DO .  pantoprazole (PROTONIX) 80 mg in sodium chloride 0.9 % 100 mL (0.8 mg/mL) infusion,  8 mg/hr, Intravenous, Continuous, Adefeso, Oladapo, DO, Last Rate: 10 mL/hr at 01/22/21 0500, 8 mg/hr at 01/22/21 0500 .  zinc sulfate capsule 220 mg, 220 mg, Oral, Daily, Adefeso, Oladapo, DO, 220 mg at 01/22/21 2751 Allergies: Acetaminophen, Lisinopril, and Norvasc [amlodipine besylate]  Complete Review of Systems: GENERAL: negative for malaise, night sweats HEENT: No changes in hearing or vision, no nose bleeds or other nasal problems. NECK: Negative for lumps, goiter, pain and significant neck swelling RESPIRATORY: Negative for cough, wheezing CARDIOVASCULAR: Negative for chest pain, leg swelling, palpitations, orthopnea GI: SEE HPI MUSCULOSKELETAL: Negative for joint pain or swelling, back pain, and muscle pain. SKIN: Negative for lesions, rash PSYCH: Negative for sleep disturbance, mood disorder and recent psychosocial stressors. HEMATOLOGY Negative for prolonged bleeding, bruising easily, and swollen nodes. ENDOCRINE: Negative for cold or heat intolerance, polyuria, polydipsia and goiter. NEURO: negative for tremor, gait imbalance, syncope and seizures. The remainder of the review of systems is noncontributory.  Physical Exam: BP (!) 143/70   Pulse 71   Temp 97.7 F (36.5 C) (Oral)   Resp 15   Ht 5' 5" (1.651 m)   Wt 64 kg   SpO2 100%   BMI 23.48 kg/m   GENERAL: The patient is AO x3, in no acute distress. HEENT: Head is normocephalic and atraumatic. EOMI are intact. Mouth is well hydrated and without lesions. NECK: Supple. No masses LUNGS: Clear to auscultation. No presence of rhonchi/wheezing/rales. Adequate chest expansion HEART: RRR, normal s1 and s2. ABDOMEN: Soft, nontender, no guarding, no peritoneal signs, and nondistended. BS +. No masses. RECTAL EXAM: deferred EXTREMITIES: Without any cyanosis, clubbing, rash, lesions or edema. NEUROLOGIC: AOx3, no focal motor deficit. SKIN: no jaundice, no rashes  Laboratory Data CBC:     Component Value Date/Time   WBC 11.2 (H) 01/21/2021 2155   RBC 2.21 (L) 01/21/2021 2155   HGB 7.4 (L) 01/21/2021 2155   HCT 23.2 (L) 01/21/2021 2155   PLT 146 (L) 01/21/2021 2155   MCV 105.0 (H) 01/21/2021 2155   MCH 33.5 01/21/2021 2155   MCHC 31.9 01/21/2021 2155   RDW 12.4 01/21/2021 2155   LYMPHSABS 0.5 (L) 01/21/2021 2155   MONOABS 0.4 01/21/2021 2155   EOSABS 0.0 01/21/2021 2155   BASOSABS 0.0 01/21/2021 2155   COAG:  Lab Results  Component Value Date   INR 1.2 01/21/2021   INR 0.92 12/02/2009    BMP:  BMP Latest Ref Rng & Units 01/21/2021 12/02/2017 12/02/2017  Glucose 70 - 99 mg/dL 422(H) 318(H) 128(H)  BUN 6 - 20 mg/dL 26(H) 12 11  Creatinine 0.44 - 1.00 mg/dL 0.80 0.84 0.73  Sodium 135 - 145 mmol/L 133(L) 131(L) 133(L)  Potassium 3.5 - 5.1 mmol/L 5.1 3.0(L) 2.6(LL)  Chloride 98 - 111 mmol/L 103 103 108  CO2 22 - 32 mmol/L 20(L) 16(L) 18(L)  Calcium 8.9 - 10.3 mg/dL 7.5(L) 8.6(L) 8.6(L)    HEPATIC:  Hepatic Function Latest Ref Rng & Units 01/21/2021 12/01/2017 12/03/2009  Total Protein 6.5 - 8.1 g/dL 4.9(L) 8.1 6.2  Albumin 3.5 - 5.0 g/dL 2.6(L) 4.6 3.5  AST 15 - 41 U/L 13(L) 23 21  ALT 0 - 44 U/L _0 Alk Phosphatase 38 - 126 U/L 65 91 70  Total Bilirubin 0.3 - 1.2 mg/dL 0.7 2.2(H) 0.9    CARDIAC:  Lab Results  Component Value Date   CKTOTAL 72 12/03/2009   CKMB 1.1  12/03/2009   TROPONINI <0.03 12/01/2017     Imaging: I  personally reviewed and interpreted the available imaging.  Assessment & Plan: Laurie Larson is a 60 y.o. female with history of type 1 diabetes, depression, hypertension and hemorrhoids, who came to the hospital after presenting episodes of vomiting, melena and lightheadedness.  Patient has presented alterations consistent with upper gastrointestinal bleeding, which I consider is likely related to the ulcer disease given use of NSAIDs.  Other etiologies include Dieulafoy lesion or AVM, less likely malignancy.  She should continue with PPI drip for now but will need to perform endoscopic investigation with an EGD today.  She has received 2 units PRBC and her vital signs have stabilized, as she had evidence of orthostatic hypotension.  I explained this to the patient in thorough detail, she understood and agreed.  I also advised her to avoid using any NSAIDs in the future as this may cause recurrence of her symptoms.  We will provide the patient a dose of Reglan to improve visualization of the gastric chamber and duodenum.  #Upper gastrointestinal bleeding #History of NSAID use - Repeat CBC after blood transfusion then repeat q8h, transfuse if Hb <7 - Pantoprazole ggt -Reglan 10 mg x 1 IV - 2 large bore IV lines - Active T/S - Keep NPO - Avoid NSAIDs - Will proceed with EGD today  Harvel Quale, MD Gastroenterology and Hepatology [facility]

## 2021-01-22 NOTE — Progress Notes (Signed)
Patient has been admitted after midnight.  She has been seen with evaluated at bedside.  Briefly:  Laurie Larson is a 60 y.o. female with medical history significant for T1DM on insulin pump, hypertension, GERD to the emergency department accompanied by her husband due to having a syncopal episode at home, she complained of nausea, vomiting, generalized weakness and several loose black stools.  Patient states that her blood glucose level was running high today to greater than 500s, she also complained of frequent urination, generalized weakness, fatigue, lightheadedness and dizziness.  She was admitted with symptomatic anemia in the setting of GI bleed.  She has undergone 2 unit PRBC transfusion on 3/13 with improvement of hemoglobin level noted and stable vital signs.  She has undergone EGD with findings of nonbleeding ulcers in the antrum of the stomach as well as a noncratered duodenal ulcer with oozing around the edges requiring ablation.  Plans are for PPI drip for 72 hours clear liquid diet for now per GI recommendations.  Follow hemoglobin and hematocrit.  Symptomatic acute blood loss anemia -Status post 2 unit PRBC transfusion -Continue to follow CBC -Status post EGD 3/13 with oozing duodenal ulcer status post ablation -Clear liquid diet -PPI drip  Syncope and collapse secondary to above -Carotid ultrasound with no significant findings -2D echocardiogram pending  Hyperglycemia in setting of type 1 diabetes -Insulin pump -SSI  Macrocytic anemia -Continue to monitor closely  Essential hypertension -Blood pressure is beginning to elevate, resume home losartan and amlodipine  Incidental COVID-19 infection -Asymptomatic, no need for remdesivir or steroids -Isolation precautions -Vitamin C and zinc  GERD -IV Protonix drip  Total care time: 35 minutes

## 2021-01-22 NOTE — H&P (Signed)
History and Physical  Laurie Larson RCB:[id number] DOB: [birth year] DOA: 01/21/2021  Referring physician: Ezequiel Essex, MD PCP: Dineen Kid, MD  Patient coming from: Home Chief Complaint: Loss of consciousness  HPI: Laurie Larson is a 60 y.o. female with medical history significant for T2DM on insulin pump, hypertension, GERD to the emergency department accompanied by her husband due to having a syncopal episode at home, she complained of nausea, vomiting, generalized weakness and several loose black stools.  Patient states that her blood glucose level was running high today to greater than 500s, she also complained of frequent urination, generalized weakness, fatigue, lightheadedness and dizziness.  She endorsed having several episodes of black and bloody stools today as well as an episode of nonbloody vomiting.  Patient states that she lost consciousness while sitting on the commode and husband who witnessed the episode states that patient lost consciousness and had some intense shaking thought to be seizure and lasted about 10-15 seconds, patient quickly recovered without a postictal state.  She denies fever, hitting her head, chest pain, headache or abdominal pain.  Patient states that she has been having on and off intermittent bloody stools over the past 6 months, she states that she had colonoscopy recently whereby polyps were removed.  ED Course:  In the emergency department, she was hemodynamically stable, though orthostatic vital signs was positive for orthostatic hypotension.  Work-up in the ED showed mild leukocytosis, macrocytic anemia, hyponatremia, hyperglycemia, troponin x 2- 31 >33, hypoalbuminemia.  FOBT was positive.  Urinalysis was positive for glycosuria and ketonuria.  SARS coronavirus 2 was positive.  CT of head without contrast showed no acute intracranial abnormality.  Patient was started on IV Protonix drip, IV hydration was provided, Zofran was given.   Hospitalist was asked to admit patient for further evaluation and management.  Review of Systems: Constitutional: Positive for fatigue.  Negative for chills and fever.  HENT: Negative for ear pain and sore throat.   Eyes: Negative for pain and visual disturbance.  Respiratory: Negative for cough, chest tightness and shortness of breath.   Cardiovascular: Positive for syncope.  Negative for chest pain and palpitations.  Gastrointestinal: Positive for blood in stool, nausea, vomiting and several episodes of loose stool.  Negative for abdominal pain  Endocrine: Negative for polyphagia and polyuria.  Genitourinary: Negative for decreased urine volume, dysuria, enuresis Musculoskeletal: Negative for arthralgias and back pain.  Skin: Negative for color change and rash.  Allergic/Immunologic: Negative for immunocompromised state.  Neurological: Positive for lightheadedness, generalized weakness, syncope.  Negative for tremors,  speech difficulty Hematological: Does not bruise/bleed easily.  All other systems reviewed and are negative  Past Medical History:  Diagnosis Date  . Benign tumor of breast    removed  . Depressive episode    rersolved  . Diabetes mellitus without complication ([place])   . Diarrhea    GI Outlaw  . History of stomach ulcers   . Hypertension   . Rectal bleeding   . Symptomatic anemia 01/22/2021   Past Surgical History:  Procedure Laterality Date  . ABDOMINAL HYSTERECTOMY     Total  . AUGMENTATION MAMMAPLASTY    . BREAST ENHANCEMENT SURGERY    . BREAST EXCISIONAL BIOPSY    . BREAST SURGERY     benign right breast  . COLONOSCOPY  12/2012   polyps benign, diverticulosis, hemorrhoids, recheck in 3 years  . ESOPHAGOGASTRODUODENOSCOPY     normal esophagus,gastritis, duodenal erosions without bleeding, bx benign    Social History:  reports that  she quit smoking about 5 months ago. Her smoking use included cigarettes. She has a 7.50 pack-year smoking history. She has  never used smokeless tobacco. She reports current alcohol use of about 5.0 standard drinks of alcohol per week. She reports that she does not use drugs.   Allergies  Allergen Reactions  . Acetaminophen Anaphylaxis  . Lisinopril Hives and Swelling  . Norvasc [Amlodipine Besylate] Swelling    Family History  Problem Relation Age of Onset  . Ovarian cancer Maternal Aunt        deceased  . Juvenile Diabetes Maternal Aunt   . Breast cancer Maternal Aunt   . Cancer Maternal Grandmother        type unknown  . Breast cancer Sister   . Breast cancer Mother        deceased     Prior to Admission medications   Medication Sig Start Date End Date Taking? Authorizing Provider  blood glucose meter kit and supplies Dispense based on patient and insurance preference. Use up to four times daily as directed. (FOR ICD-10 E10.9, E11.9). 12/02/17   Dhungel, Flonnie Overman, MD  Insulin Detemir (LEVEMIR) 100 UNIT/ML Pen Inject 15 Units into the skin daily at 10 pm. 12/02/17   Dhungel, Flonnie Overman, MD  Insulin Pen Needle 31G X 5 MM MISC 1 application by Does not apply route at bedtime. 12/02/17   Dhungel, Flonnie Overman, MD  losartan (COZAAR) 100 MG tablet Take 1 tablet (100 mg total) by mouth daily. 12/02/17 12/02/18  Dhungel, Flonnie Overman, MD  metFORMIN (GLUCOPHAGE) 500 MG tablet Take 1 tablet (500 mg total) by mouth 2 (two) times daily with a meal. 12/02/17 12/02/18  Dhungel, Nishant, MD  Multiple Vitamin (MULTIVITAMIN WITH MINERALS) TABS tablet Take 1 tablet by mouth daily.    [provider]  omeprazole (PRILOSEC) 20 MG capsule Take 1 capsule by mouth daily. 09/14/17   [provider]    Physical Exam: BP 124/69   Pulse 70   Temp 98.1 F (36.7 C) (Oral)   Resp 11   Ht _0  (1.651 m)   Wt 64 kg   SpO2 100%   BMI 23.48 kg/m   . General: 60 y.o. year-old female well developed well nourished in no acute distress.  Alert and oriented x3. Marland Kitchen HEENT: Dry mucous membrane.  NCAT, EOMI, pale conjunctiva . Neck:  Supple, trachea medial . Cardiovascular: Regular rate and rhythm with no rubs or gallops.  No thyromegaly or JVD noted.  2/4 pulses in all 4 extremities. Marland Kitchen Respiratory: Clear to auscultation with no wheezes or rales. Good inspiratory effort. . Abdomen: Soft nontender nondistended with normal bowel sounds x4 quadrants. . Muskuloskeletal: No cyanosis, clubbing or edema noted bilaterally . Neuro: CN II-XII intact, strength 5/5 x 4, sensation, reflexes intact . Skin: No ulcerative lesions noted or rashes . Psychiatry: Judgement and insight appear normal. Mood is appropriate for condition and setting          Labs on Admission:  Basic Metabolic Panel: Recent Labs  Lab 01/21/21 2155  NA 133*  K 5.1  CL 103  CO2 20*  GLUCOSE 422*  BUN 26*  CREATININE 0.80  CALCIUM 7.5*   Liver Function Tests: Recent Labs  Lab 01/21/21 2155  AST 13*  ALT 13  ALKPHOS 65  BILITOT 0.7  PROT 4.9*  ALBUMIN 2.6*   No results for input(s): LIPASE, AMYLASE in the last 168 hours. No results for input(s): AMMONIA in the last 168 hours. CBC: Recent Labs  Lab 01/21/21 2155  WBC 11.2*  NEUTROABS 10.3*  HGB 7.4*  HCT 23.2*  MCV 105.0*  PLT 146*   Cardiac Enzymes: No results for input(s): CKTOTAL, CKMB, CKMBINDEX, TROPONINI in the last 168 hours.  BNP (last 3 results) No results for input(s): BNP in the last 8760 hours.  ProBNP (last 3 results) No results for input(s): PROBNP in the last 8760 hours.  CBG: Recent Labs  Lab 01/21/21 2110 01/21/21 2226 01/22/21 0043  GLUCAP 366* 341* 324*    Radiological Exams on Admission: CT Head Wo Contrast  Result Date: 01/21/2021 CLINICAL DATA:  Syncope. EXAM: CT HEAD WITHOUT CONTRAST TECHNIQUE: Contiguous axial images were obtained from the base of the skull through the vertex without intravenous contrast. COMPARISON:  December 01, 2017 FINDINGS: Brain: There is mild cerebral atrophy with widening of the extra-axial spaces and ventricular dilatation.  There are areas of decreased attenuation within the white matter tracts of the supratentorial brain, consistent with microvascular disease changes. Vascular: No hyperdense vessel or unexpected calcification. Skull: Normal. Negative for fracture or focal lesion. Sinuses/Orbits: No acute finding. Other: None. IMPRESSION: 1. Generalized cerebral atrophy. 2. No acute intracranial abnormality. Electronically Signed   By: Virgina Norfolk M.D.   On: 01/21/2021 23:53    EKG: I independently viewed the EKG done and my findings are as followed: Normal sinus rhythm at a rate of 98 bpm  Assessment/Plan Present on Admission: . GI bleed  Principal Problem:   GI bleed Active Problems:   Syncope and collapse   Nausea & vomiting   Diarrhea   Symptomatic anemia   Macrocytic anemia   Hyperglycemia due to type 1 diabetes mellitus ([place])   COVID-19 virus infection   Hypoalbuminemia   Orthostatic hypotension   GI bleed with symptomatic anemia H/H= 7.4/23.2 this was 12.4/34.4 on 12/02/2017 (most recent labs for comparison) Hemoccult was positive Type and crossmatch was done Transfusion of PRBC was initiated in the ED Continue IV Protonix drip Gastroenterologist will be consulted to see patient in the morning  Orthostatic hypotension Orthostatic vital signs was positive when patient went from sitting position (124/23) to standing position (104/79) IV hydration was provided in the ED  Syncope and collapse Continue telemetry and watch for arrhythmias Troponins 31 > 33, to trend troponin  EKG showed normal sinus rhythm at a rate of 98 bpm Echocardiogram will be done in the morning to rule out significant aortic stenosis or other outflow obstruction, and also to evaluate EF and to rule out segmental/Regional wall motion abnormalities. Check carotid artery Dopplers to rule out hemodynamically significant stenosis  CT of head withoutcontrast showed no acute intracranial abnormality EEG will be done to  rule out seizures  Nausea, vomiting and diarrhea Continue IV Zofran 4 mg every 6 hours as needed Patient has not had any diarrhea since arrival to the ED IV hydration was provided  Dehydration IV hydration provided in the ED  Macrocytic Anemia MCV 105; folate and B12 levels will be checked  Hypoalbuminemia possibly secondary to moderate protein calorie malnutrition Albumin 2.6; protein supplement will be provided  Hyperglycemia due to T2DM Continue insulin pump Metformin will be held at this time Consider ISS and hypoglycemic protocol if blood glucose not well controlled with insulin pump  Incidental finding of COVID-19 virus infection SARS coronavirus 2 was positive Patient was not hypoxic, swollen there is no indication for IV remdesivir or Solu-Medrol at this time Continue vitamin-C 500 mg p.o. Daily Continue zinc 220 mg p.o. Daily  Essential  hypertension (controlled) Continue home meds when med rec is updated  GERD Continue IV Protonix   DVT prophylaxis: SCDs  Code Status: Full code  Family Communication: Husband at bedside (all questions answered to satisfaction)  Disposition Plan:  Patient is from:                        home Anticipated DC to:                   SNF or family members home Anticipated DC date:               2-3 days Anticipated DC barriers:         patient is unstable to be discharged at this time due to GI bleed, syncope which require further work-up   Consults called: Gastroenterology  Admission status: Inpatient    Bernadette Hoit MD [facility]   01/22/2021, 5:12 AM

## 2021-01-22 NOTE — ED Notes (Signed)
Date and time results received: 01/22/21 0152   Test: COVID Critical Value: POSITIVE  Name of Provider Notified: Rancour, MD

## 2021-01-22 NOTE — Op Note (Signed)
[facility] Patient Name: Laurie Larson Procedure Date: 01/22/2021 10:24 AM MRN: [id number] Date of Birth: 1961/01/26 Attending MD: Maylon Peppers ,  CSN: [id number] Age: 60 Admit Type: Inpatient Procedure:                Upper GI endoscopy Indications:               Providers:                Maylon Peppers, Lurline Del, RN, Casimer Bilis, Technician, Randa Spike, Technician Referring MD:              Medicines:                Monitored Anesthesia Care Complications:            No immediate complications. Estimated Blood Loss:     Estimated blood loss: none. Procedure:                Pre-Anesthesia Assessment:                           - Prior to the procedure, a History and Physical                            was performed, and patient medications, allergies                            and sensitivities were reviewed. The patient's                            tolerance of previous anesthesia was reviewed.                           - The risks and benefits of the procedure and the                            sedation options and risks were discussed with the                            patient. All questions were answered and informed                            consent was obtained.                           - ASA Grade Assessment: II - A patient with mild                            systemic disease.                           - Adequate visualization was aided with the use of                            a transparent cap attached to the  distal part of                            the endoscope.                           After obtaining informed consent, the endoscope was                            passed under direct vision. Throughout the                            procedure, the patient's blood pressure, pulse, and                            oxygen saturations were monitored continuously. The                            [id number]-[id number]  ([id number]) scope was introduced through the                            mouth, and advanced to the second part of duodenum.                            The upper GI endoscopy was accomplished without                            difficulty. Adequate visualization was aided with                            the use of a transparent cap attached to the distal                            part of the endoscope. The patient tolerated the                            procedure well. Scope In: 10:59:31 AM Scope Out: 11:25:29 AM Total Procedure Duration: 0 hours 25 minutes 58 seconds  Findings:      The examined esophagus was normal.      A few localized small erosions with no stigmata of recent bleeding were       found in the gastric antrum. Biopsies from body and antrum were taken       with a cold forceps for Helicobacter pylori testing.      One cratered duodenal ulcer with a nonbleeding visible vessel (Forrest       Class IIa) was found in the first portion of the duodenum. The edges of       the ulcer were slowly oozing. The lesion was 10 mm in largest dimension.       There was significant associated edema and erythema. Edges of the ulcer       were successfully injected with 5 mL of a 1:10,000 solution of       epinephrine for hemostasis. Coagulation for hemostasis at the margins of       the ulcer and visible vessel using bipolar probe  was successful. Impression:               - Normal esophagus.                           - Erosive gastropathy with no stigmata of recent                            bleeding. Biopsied.                           - Oozing duodenal ulcer with a nonbleeding visible                            vessel (Forrest Class IIa). Injected. Treated with                            bipolar cautery. Moderate Sedation:      Per Anesthesia Care Recommendation:           - Return patient to hospital ward for ongoing care.                           - Clear liquid diet today.                            - Continue PPI drip for 72 hours.                           - No ibuprofen, naproxen, or other non-steroidal                            anti-inflammatory drugs indefinitely.                           - Await pathology results.                           - Check H. pylori serology. Procedure Code(s):        --- Professional ---                           [phone], 79, Esophagogastroduodenoscopy, flexible,                            transoral; with control of bleeding, any method                           43239, Esophagogastroduodenoscopy, flexible,                            transoral; with biopsy, single or multiple Diagnosis Code(s):        --- Professional ---                           K31.89, Other diseases of stomach and duodenum  K26.4, Chronic or unspecified duodenal ulcer with                            hemorrhage CPT copyright 2019 American Medical Association. All rights reserved. The codes documented in this report are preliminary and upon coder review may  be revised to meet current compliance requirements. Maylon Peppers, MD Maylon Peppers,  01/22/2021 11:53:51 AM This report has been signed electronically. Number of Addenda: 0

## 2021-01-22 NOTE — Transfer of Care (Signed)
Immediate Anesthesia Transfer of Care Note  Patient: Laurie Larson  Procedure(s) Performed: ESOPHAGOGASTRODUODENOSCOPY (EGD) WITH PROPOFOL (N/A )  Patient Location: PACU  Anesthesia Type:General  Level of Consciousness: awake, alert , oriented and sedated  Airway & Oxygen Therapy: Patient Spontanous Breathing and Patient connected to nasal cannula oxygen  Post-op Assessment: Report given to RN  Post vital signs: Reviewed and stable  Last Vitals:  Vitals Value Taken Time  BP 163/89 1133  Temp 97.1 1133  Pulse 73 1133  Resp 12 1133  SpO2 100 1133    Last Pain:  Vitals:   01/22/21 0840  TempSrc: Oral  PainSc:          Complications: No complications documented.

## 2021-01-22 NOTE — Anesthesia Preprocedure Evaluation (Addendum)
Anesthesia Evaluation  Patient identified by MRN, date of birth, ID band Patient awake    Reviewed: Allergy & Precautions, NPO status , Patient's Chart, lab work & pertinent test results  Airway Mallampati: II  TM Distance: >3 FB Neck ROM: Full    Dental  (+) Teeth Intact, Dental Advisory Given   Pulmonary former smoker,    Pulmonary exam normal breath sounds clear to auscultation       Cardiovascular Exercise Tolerance: Good hypertension, Pt. on medications Normal cardiovascular exam Rhythm:Regular Rate:Normal  21-Jan-2021 21:58:42 [place] [facility] ROUTINE RECORD Sinus rhythm No significant change was found No significant change was found Reconfirmed by Ezequiel Essex [id number]) on 01/21/2021 11:25:08 PM   Neuro/Psych PSYCHIATRIC DISORDERS Depression negative neurological ROS     GI/Hepatic Neg liver ROS, Medicated,Upper GI bleed   Endo/Other  diabetes, Poorly Controlled, Type 2, Insulin Dependent  Renal/GU negative Renal ROS     Musculoskeletal   Abdominal   Peds  Hematology  (+) anemia ,   Anesthesia Other Findings   Reproductive/Obstetrics                            Anesthesia Physical Anesthesia Plan  ASA: III and emergent  Anesthesia Plan: General   Post-op Pain Management:    Induction:   PONV Risk Score and Plan:   Airway Management Planned: Nasal Cannula, Natural Airway and Simple Face Mask  Additional Equipment:   Intra-op Plan:   Post-operative Plan: Extubation in OR  Informed Consent: I have reviewed the patients History and Physical, chart, labs and discussed the procedure including the risks, benefits and alternatives for the proposed anesthesia with the patient or authorized representative who has indicated his/her understanding and acceptance.     Dental advisory given  Plan Discussed with: Surgeon  Anesthesia Plan Comments:         Anesthesia Quick Evaluation

## 2021-01-22 NOTE — Progress Notes (Signed)
  Echocardiogram 2D Echocardiogram has been performed.  Laurie Larson 01/22/2021, 8:08 AM

## 2021-01-22 NOTE — Anesthesia Postprocedure Evaluation (Signed)
Anesthesia Post Note  Patient: Laurie Larson  Procedure(s) Performed: ESOPHAGOGASTRODUODENOSCOPY (EGD) WITH PROPOFOL (N/A )  Patient location during evaluation: ICU Anesthesia Type: General Level of consciousness: awake and alert and oriented Pain management: pain level controlled Vital Signs Assessment: post-procedure vital signs reviewed and stable Respiratory status: spontaneous breathing Cardiovascular status: blood pressure returned to baseline and stable Postop Assessment: no apparent nausea or vomiting Anesthetic complications: no   No complications documented.   Last Vitals:  Vitals:   01/22/21 1130 01/22/21 1142  BP: (!) 163/89 (!) 189/86  Pulse: 74 70  Resp: 17 16  Temp: 36.6 C   SpO2: 100%     Last Pain:  Vitals:   01/22/21 1146  TempSrc:   PainSc: 0-No pain                 Rajamani C Battula

## 2021-01-22 NOTE — Brief Op Note (Signed)
01/21/2021 - 01/22/2021  11:52 AM  PATIENT:  Nyra Capes  60 y.o. female  PRE-OPERATIVE DIAGNOSIS:  upper gastrointestinal bleeding  POST-OPERATIVE DIAGNOSIS:  edemous, mduodenal mucosa; duodenal ulcers; gastric erosions;  PROCEDURE:  Procedure(s): ESOPHAGOGASTRODUODENOSCOPY (EGD) WITH PROPOFOL (N/A)  SURGEON:  Surgeon(s) and Role:    * Harvel Quale, MD - Primary  Underwent EGD under propofol sedation today.  Esophagus was normal.  Stomach had presence of few nonbleeding localized erosions in the antrum.  Biopsies from the body and antrum were taken to rule out H. pylori.  There was visualization of a 1 cm cratered duodenal ulcer in the first portion of the duodenum.  There was presence of mild oozing from the edges of the ulcer, along with presence of a single vessel in the middle of the ulcer (nipple sign).  I injected a total of 5 cc of 1:10,000 epinephrine in the edges of the ulcer with adequate blanching.  This was followed by thermal ablation with gold probe of the vessel and the edges of the ulcer.  Adequate hemostasis was achieved.  There was no presence of blood in the second portion of the duodenum.  Recommendations: - Return patient to hospital ward for ongoing care.  - Clear liquid diet today.  - Continue PPI drip for 72 hours. - No ibuprofen, naproxen, or other non-steroidal anti-inflammatory drugs indefinitely.  - Await pathology results.  - Check H. pylori serology.  Maylon Peppers, MD Gastroenterology and Hepatology [facility]

## 2021-01-23 DIAGNOSIS — K264 Chronic or unspecified duodenal ulcer with hemorrhage: Principal | ICD-10-CM

## 2021-01-23 DIAGNOSIS — K922 Gastrointestinal hemorrhage, unspecified: Secondary | ICD-10-CM | POA: Diagnosis not present

## 2021-01-23 LAB — CBC
HCT: 24.7 % — ABNORMAL LOW (ref 36.0–46.0)
Hemoglobin: 8.2 g/dL — ABNORMAL LOW (ref 12.0–15.0)
MCH: 32.3 pg (ref 26.0–34.0)
MCHC: 33.2 g/dL (ref 30.0–36.0)
MCV: 97.2 fL (ref 80.0–100.0)
Platelets: 110 10*3/uL — ABNORMAL LOW (ref 150–400)
RBC: 2.54 MIL/uL — ABNORMAL LOW (ref 3.87–5.11)
RDW: 16 % — ABNORMAL HIGH (ref 11.5–15.5)
WBC: 5 10*3/uL (ref 4.0–10.5)
nRBC: 0 % (ref 0.0–0.2)

## 2021-01-23 LAB — BPAM RBC
Blood Product Expiration Date: 202203292359
Blood Product Expiration Date: 202203292359
ISSUE DATE / TIME: 202203130101
ISSUE DATE / TIME: 202203130533
Unit Type and Rh: 600
Unit Type and Rh: 600

## 2021-01-23 LAB — COMPREHENSIVE METABOLIC PANEL
ALT: 13 U/L (ref 0–44)
AST: 15 U/L (ref 15–41)
Albumin: 2.4 g/dL — ABNORMAL LOW (ref 3.5–5.0)
Alkaline Phosphatase: 49 U/L (ref 38–126)
Anion gap: 7 (ref 5–15)
BUN: 7 mg/dL (ref 6–20)
CO2: 21 mmol/L — ABNORMAL LOW (ref 22–32)
Calcium: 8 mg/dL — ABNORMAL LOW (ref 8.9–10.3)
Chloride: 112 mmol/L — ABNORMAL HIGH (ref 98–111)
Creatinine, Ser: 0.55 mg/dL (ref 0.44–1.00)
GFR, Estimated: >60 mL/min (ref >60)
Glucose, Bld: 159 mg/dL — ABNORMAL HIGH (ref 70–99)
Potassium: 3.6 mmol/L (ref 3.5–5.1)
Sodium: 140 mmol/L (ref 135–145)
Total Bilirubin: 0.5 mg/dL (ref 0.3–1.2)
Total Protein: 4.5 g/dL — ABNORMAL LOW (ref 6.5–8.1)

## 2021-01-23 LAB — TYPE AND SCREEN
ABO/RH(D): A POS
Antibody Screen: NEGATIVE
Unit division: 0
Unit division: 0

## 2021-01-23 LAB — GLUCOSE, CAPILLARY
Glucose-Capillary: 219 mg/dL — ABNORMAL HIGH (ref 70–99)
Glucose-Capillary: 246 mg/dL — ABNORMAL HIGH (ref 70–99)
Glucose-Capillary: 163 mg/dL — ABNORMAL HIGH (ref 70–99)
Glucose-Capillary: 155 mg/dL — ABNORMAL HIGH (ref 70–99)
Glucose-Capillary: 123 mg/dL — ABNORMAL HIGH (ref 70–99)
Glucose-Capillary: 73 mg/dL (ref 70–99)

## 2021-01-23 LAB — MAGNESIUM: Magnesium: 1.3 mg/dL — ABNORMAL LOW (ref 1.7–2.4)

## 2021-01-23 MED ORDER — MAGNESIUM SULFATE 2 GM/50ML IV SOLN
2.0000 g | Freq: Once | INTRAVENOUS | Status: AC
  Administered 2021-01-23: 2 g via INTRAVENOUS
  Filled 2021-01-23: qty 50

## 2021-01-23 MED ORDER — INSULIN PUMP
Freq: Three times a day (TID) | SUBCUTANEOUS | Status: DC
  Administered 2021-01-24: 2.5 via SUBCUTANEOUS
  Administered 2021-01-24: 5.2 via SUBCUTANEOUS
  Administered 2021-01-24: 5 via SUBCUTANEOUS
  Filled 2021-01-23: qty 1

## 2021-01-23 NOTE — Progress Notes (Signed)
Insulin pump contract signed and placed in chart.

## 2021-01-23 NOTE — Progress Notes (Signed)
Subjective: No abdominal pain. Last melena overnight. Small amount of bright red blood overnight with melena. No N/V. Feels better today.   Objective: Vital signs in last 24 hours: Temp:  [97.7 F (36.5 C)-98.8 F (37.1 C)] 98.8 F (37.1 C) (03/14 0800) Pulse Rate:  [68-73] 68 (03/14 0800) Resp:  [11-18] 17 (03/14 1000) BP: (126-169)/(65-91) 169/91 (03/14 0800) SpO2:  [96 %-99 %] 98 % (03/14 0800) Last BM Date: 01/22/21 General:   Alert and oriented, pleasant Head:  Normocephalic and atraumatic. Abdomen:  Bowel sounds present, soft, non-tender, non-distended. No HSM or hernias noted. No rebound or guarding. No masses appreciated  Extremities:  Without edema. Neurologic:  Alert and  oriented x4   Intake/Output from previous day: 03/13 0701 - 03/14 0700 In: 3990.7 [P.O.:1120; I.V.:2555.7; Blood:315] Out: -  Intake/Output this shift: No intake/output data recorded.  Lab Results: Recent Labs    01/21/21 2155 01/22/21 1219 01/23/21 0436  WBC 11.2* 8.5 5.0  HGB 7.4* 8.7* 8.2*  HCT 23.2* 26.1* 24.7*  PLT 146* 118* 110*   BMET Recent Labs    01/21/21 2155 01/22/21 1219 01/23/21 0436  NA 133* 136 140  K 5.1 3.7 3.6  CL 103 108 112*  CO2 20* 20* 21*  GLUCOSE 422* 274* 159*  BUN 26* 16 7  CREATININE 0.80 0.64 0.55  CALCIUM 7.5* 7.5* 8.0*   LFT Recent Labs    01/21/21 2155 01/22/21 1219 01/23/21 0436  PROT 4.9* 4.6* 4.5*  ALBUMIN 2.6* 2.5* 2.4*  AST 13* 15 15  ALT 13 12 13   ALKPHOS 65 56 49  BILITOT 0.7 0.9 0.5   PT/INR Recent Labs    01/21/21 2155  LABPROT 14.6  INR 1.2    Studies/Results: CT Head Wo Contrast  Result Date: 01/21/2021 CLINICAL DATA:  Syncope. EXAM: CT HEAD WITHOUT CONTRAST TECHNIQUE: Contiguous axial images were obtained from the base of the skull through the vertex without intravenous contrast. COMPARISON:  December 01, 2017 FINDINGS: Brain: There is mild cerebral atrophy with widening of the extra-axial spaces and ventricular  dilatation. There are areas of decreased attenuation within the white matter tracts of the supratentorial brain, consistent with microvascular disease changes. Vascular: No hyperdense vessel or unexpected calcification. Skull: Normal. Negative for fracture or focal lesion. Sinuses/Orbits: No acute finding. Other: None. IMPRESSION: 1. Generalized cerebral atrophy. 2. No acute intracranial abnormality. Electronically Signed   By: Virgina Norfolk M.D.   On: 01/21/2021 23:53   US Carotid Bilateral  Result Date: 01/22/2021 CLINICAL DATA:  Syncope, hypertension, diabetes EXAM: BILATERAL CAROTID DUPLEX ULTRASOUND TECHNIQUE: Pearline Cables scale imaging, color Doppler and duplex ultrasound were performed of bilateral carotid and vertebral arteries in the neck. COMPARISON:  None. FINDINGS: Criteria: Quantification of carotid stenosis is based on velocity parameters that correlate the residual internal carotid diameter with NASCET-based stenosis levels, using the diameter of the distal internal carotid lumen as the denominator for stenosis measurement. The following velocity measurements were obtained: RIGHT ICA: 88/31 cm/sec CCA: 34/19 cm/sec SYSTOLIC ICA/CCA RATIO:  1.0 ECA: 117 cm/sec LEFT ICA: 98/33 cm/sec CCA: 37/90 cm/sec SYSTOLIC ICA/CCA RATIO:  1.3 ECA: 88 cm/sec RIGHT CAROTID ARTERY: Minor echogenic shadowing plaque formation. No hemodynamically significant right ICA stenosis, velocity elevation, or turbulent flow. Degree of narrowing less than 50%. RIGHT VERTEBRAL ARTERY:  Normal antegrade flow LEFT CAROTID ARTERY: Similar scattered minor echogenic plaque formation. No hemodynamically significant left ICA stenosis, velocity elevation, or turbulent flow. LEFT VERTEBRAL ARTERY:  Normal antegrade flow IMPRESSION: Minor carotid  atherosclerosis. No hemodynamically significant ICA stenosis. Degree of narrowing less than 50% bilaterally by ultrasound criteria. Patent antegrade vertebral flow bilaterally Electronically Signed    By: Jerilynn Mages.  Shick M.D.   On: 01/22/2021 10:41   ECHOCARDIOGRAM LIMITED  Result Date: 01/22/2021    ECHOCARDIOGRAM LIMITED REPORT   Patient Name:   Laurie Larson Date of Exam: 01/22/2021 Medical Rec #:  [id number]             Height:       65.0 in Accession #:    [id number]            Weight:       141.1 lb Date of Birth:  1961/01/20            BSA:          1.706 m Patient Age:    60 years              BP:           106/57 mmHg Patient Gender: F                     HR:           69 bpm. Exam Location:  [facility] Procedure: Cardiac Doppler, Color Doppler and Limited Echo Indications:    Syncope  History:        Patient has no prior history of Echocardiogram examinations.                 Signs/Symptoms:Syncope and Hypotension; Risk Factors:Diabetes.                 COVID+, GI bleed.  Sonographer:    Dustin Flock RDCS Referring Phys: [id number] OLADAPO ADEFESO IMPRESSIONS  1. Left ventricular ejection fraction, by estimation, is 65 to 70%. The left ventricle has normal function. The left ventricle has no regional wall motion abnormalities.  2. Right ventricular systolic function is normal. The right ventricular size is normal. There is normal pulmonary artery systolic pressure.  3. The mitral valve is normal in structure. Trivial mitral valve regurgitation. No evidence of mitral stenosis.  4. The aortic valve is normal in structure. Aortic valve regurgitation is not visualized. No aortic stenosis is present.  5. The inferior vena cava is normal in size with greater than 50% respiratory variability, suggesting right atrial pressure of 3 mmHg. FINDINGS  Left Ventricle: Left ventricular ejection fraction, by estimation, is 65 to 70%. The left ventricle has normal function. The left ventricle has no regional wall motion abnormalities. The left ventricular internal cavity size was normal in size. There is  no concentric left ventricular hypertrophy. Right Ventricle: The right ventricular size is normal. No  increase in right ventricular wall thickness. Right ventricular systolic function is normal. There is normal pulmonary artery systolic pressure. The tricuspid regurgitant velocity is 1.71 m/s, and  with an assumed right atrial pressure of 3 mmHg, the estimated right ventricular systolic pressure is 82.9 mmHg. Left Atrium: Left atrial size was normal in size. Right Atrium: Right atrial size was normal in size. Pericardium: There is no evidence of pericardial effusion. Mitral Valve: The mitral valve is normal in structure. Trivial mitral valve regurgitation. No evidence of mitral valve stenosis. Tricuspid Valve: The tricuspid valve is normal in structure. Tricuspid valve regurgitation is trivial. No evidence of tricuspid stenosis. Aortic Valve: The aortic valve is normal in structure. Aortic valve regurgitation is not visualized. No aortic stenosis is present. Aortic valve peak gradient  measures 13.8 mmHg. Pulmonic Valve: The pulmonic valve was normal in structure. Pulmonic valve regurgitation is not visualized. No evidence of pulmonic stenosis. Aorta: The aortic root is normal in size and structure. Venous: The inferior vena cava is normal in size with greater than 50% respiratory variability, suggesting right atrial pressure of 3 mmHg. IAS/Shunts: No atrial level shunt detected by color flow Doppler. LEFT VENTRICLE PLAX 2D LVIDd:         4.65 cm  Diastology LVIDs:         2.79 cm  LV e' medial:    9.68 cm/s LV PW:         1.06 cm  LV E/e' medial:  9.7 LV IVS:        1.02 cm  LV e' lateral:   11.00 cm/s LVOT diam:     2.00 cm  LV E/e' lateral: 8.5 LVOT Area:     3.14 cm  RIGHT VENTRICLE RV S prime:     15.10 cm/s LEFT ATRIUM         Index LA diam:    2.60 cm 1.52 cm/m  AORTIC VALVE AV Vmax:      186.00 cm/s AV Peak Grad: 13.8 mmHg  AORTA Ao Root diam: 2.80 cm MITRAL VALVE               TRICUSPID VALVE MV Area (PHT): 5.38 cm    TR Peak grad:   11.7 mmHg MV Decel Time: 141 msec    TR Vmax:        171.00 cm/s MV E  velocity: 93.80 cm/s MV A velocity: 62.10 cm/s  SHUNTS MV E/A ratio:  1.51        Systemic Diam: 2.00 cm Skeet Latch md Electronically signed by Skeet Latch md Signature Date/Time: 01/22/2021/1:49:13 PM    Final     Assessment: 60 year old very pleasant female admitted with melena and symptomatic anemia due to 1 cm cratered duodenal ulcer with mild oozing along edges of ulcer and presence of single vessel in middle of ulcer, s/p epi and thermal ablation. Felt likely due to NSAIDs. H.pylori serology is pending along with surgical pathology.   Acute blood loss anemia: admitting Hgb 7.4, receiving 2 units PRBCs. Hgb 8.2 today, slightly down from 8.7 yesterday but does not appear to be actively bleeding.   Clinically improving and appropriate for advancement to soft diet this afternoon. Will need PPI infusion for total of 72 hours.   As of note, incidentally found to be Covid positive but is asymptomatic.   Plan: Continue PPI infusion total of 72 hours (started evening of 3/12) Avoid NSAIDs Follow-up on H.pylori serology Follow-up on pathology Advance to soft diet Colonoscopy as outpatient Patient desires to follow-up with Dr. Jenetta Downer as outpatient   Annitta Needs, PhD, ANP-BC [facility] Gastroenter[facility]    LOS: 1 day    01/23/2021, 12:11 PM

## 2021-01-23 NOTE — Progress Notes (Signed)
PROGRESS NOTE    Laurie Larson  IRW:[id number] DOB: Jul 13, 1961 DOA: 01/21/2021 PCP: Dineen Kid, MD   Brief Narrative:   Laurie Larson a 60 y.o.femalewith medical history significant forT1DMon insulin pump, hypertension, GERD to the emergency department accompanied by her husband due to having a syncopal episode at home, she complained of nausea, vomiting, generalized weakness and several loose black stools. Patient states that herblood glucose level was running high today to greater than 500s,she also complained of frequent urination, generalized weakness, fatigue, lightheadedness and dizziness.  She was admitted with symptomatic anemia in the setting of GI bleed.  She has undergone 2 unit PRBC transfusion on 3/13 with improvement of hemoglobin level noted and stable vital signs.  She has undergone EGD with findings of nonbleeding ulcers in the antrum of the stomach as well as a cratered duodenal ulcer with visible vessel status post cauterization.  Assessment & Plan:   Principal Problem:   GI bleed Active Problems:   Syncope and collapse   Nausea & vomiting   Diarrhea   Symptomatic anemia   Macrocytic anemia   COVID-19 virus infection   Hypoalbuminemia   Orthostatic hypotension   Dehydration   Hyperglycemia due to type 2 diabetes mellitus (HCC)   Symptomatic acute blood loss anemia-stabilizing -Status post 2 unit PRBC transfusion on admission -Continue to follow CBC -Status post EGD 3/13 with oozing duodenal ulcer status post ablation -Clear liquid diet advance per GI -PPI drip  Syncope and collapse secondary to above -Carotid ultrasound with no significant findings -2D echocardiogram with LVEF 65 to 70% with no valvular findings  Hypomagnesemia -Replete and reevaluate in a.m.  Hyperglycemia in setting of type 1 diabetes-improved -Insulin pump -SSI  Macrocytic anemia-stable -Continue to monitor closely -P61 and folic acid levels within  normal limits  Essential hypertension-stable -Blood pressure is beginning to elevate, continue home losartan and amlodipine  Incidental COVID-19 infection -Asymptomatic, no need for remdesivir or steroids -Isolation precautions -Vitamin C and zinc  GERD -IV Protonix drip to continue for total 72 hours   DVT prophylaxis:SCDs Code Status: Full Family Communication: Tried calling Husband 3/13 and 3/14 with no response Disposition Plan:  Status is: Inpatient  Remains inpatient appropriate because:IV treatments appropriate due to intensity of illness or inability to take PO and Inpatient level of care appropriate due to severity of illness   Dispo: The patient is from: Home              Anticipated d/c is to: Home              Patient currently is not medically stable to d/c.   Difficult to place patient No   Consultants:   GI  Procedures:   EGD 3/13  Antimicrobials:   None   Subjective: Patient seen and evaluated today with no new acute complaints or concerns. No acute concerns or events noted overnight.  No acute bleeding noted overnight.  Objective: Vitals:   01/23/21 0400 01/23/21 0800 01/23/21 0900 01/23/21 1000  BP: (!) 167/75 (!) 169/91    Pulse: 70 68    Resp: 11 18 15 17   Temp: 97.9 F (36.6 C) 98.8 F (37.1 C)    TempSrc: Oral Oral    SpO2: 96% 98%    Weight:      Height:        Intake/Output Summary (Last 24 hours) at 01/23/2021 1117 Last data filed at 01/23/2021 0600 Gross per 24 hour  Intake 3645.7 ml  Output --  Net 3645.7  ml   Filed Weights   01/21/21 2106 01/22/21 0416  Weight: 57.2 kg 64 kg    Examination:  General exam: Appears calm and comfortable  Respiratory system: Clear to auscultation. Respiratory effort normal. Cardiovascular system: S1 & S2 heard, RRR.  Gastrointestinal system: Abdomen is soft Central nervous system: Alert and awake Extremities: No edema Skin: No significant lesions noted Psychiatry: Flat  affect.    Data Reviewed: I have personally reviewed following labs and imaging studies  CBC: Recent Labs  Lab 01/21/21 2155 01/22/21 1219 01/23/21 0436  WBC 11.2* 8.5 5.0  NEUTROABS 10.3*  --   --   HGB 7.4* 8.7* 8.2*  HCT 23.2* 26.1* 24.7*  MCV 105.0* 97.8 97.2  PLT 146* 118* 741*   Basic Metabolic Panel: Recent Labs  Lab 01/21/21 2155 01/22/21 1219 01/23/21 0436  NA 133* 136 140  K 5.1 3.7 3.6  CL 103 108 112*  CO2 20* 20* 21*  GLUCOSE 422* 274* 159*  BUN 26* 16 7  CREATININE 0.80 0.64 0.55  CALCIUM 7.5* 7.5* 8.0*  MG  --  1.4* 1.3*  PHOS  --  2.4*  --    GFR: Estimated Creatinine Clearance: 69.8 mL/min (by C-G formula based on SCr of 0.55 mg/dL). Liver Function Tests: Recent Labs  Lab 01/21/21 2155 01/22/21 1219 01/23/21 0436  AST 13* 15 15  ALT 13 12 13   ALKPHOS 65 56 49  BILITOT 0.7 0.9 0.5  PROT 4.9* 4.6* 4.5*  ALBUMIN 2.6* 2.5* 2.4*   No results for input(s): LIPASE, AMYLASE in the last 168 hours. No results for input(s): AMMONIA in the last 168 hours. Coagulation Profile: Recent Labs  Lab 01/21/21 2155  INR 1.2   Cardiac Enzymes: No results for input(s): CKTOTAL, CKMB, CKMBINDEX, TROPONINI in the last 168 hours. BNP (last 3 results) No results for input(s): PROBNP in the last 8760 hours. HbA1C: Recent Labs    01/21/21 0000  HGBA1C 6.1*   CBG: Recent Labs  Lab 01/22/21 1046 01/22/21 1203 01/22/21 1700 01/22/21 2211 01/23/21 0756  GLUCAP 219* 246* 203* 170* 163*   Lipid Profile: No results for input(s): CHOL, HDL, LDLCALC, TRIG, CHOLHDL, LDLDIRECT in the last 72 hours. Thyroid Function Tests: No results for input(s): TSH, T4TOTAL, FREET4, T3FREE, THYROIDAB in the last 72 hours. Anemia Panel: Recent Labs    01/22/21 1219  VITAMINB12 483  FOLATE 16.1   Sepsis Labs: No results for input(s): PROCALCITON, LATICACIDVEN in the last 168 hours.  Recent Results (from the past 240 hour(s))  Resp Panel by RT-PCR (Flu A&B, Covid)  Nasopharyngeal Swab     Status: Abnormal   Collection Time: 01/22/21 12:40 AM   Specimen: Nasopharyngeal Swab; Nasopharyngeal(NP) swabs in vial transport medium  Result Value Ref Range Status   SARS Coronavirus 2 by RT PCR POSITIVE (A) NEGATIVE Final    Comment: RESULT CALLED TO, READ BACK BY AND VERIFIED WITH: SAPPELT,J @ 0152 ON 01/22/21 BY JUW (NOTE) SARS-CoV-2 target nucleic acids are DETECTED.  The SARS-CoV-2 RNA is generally detectable in upper respiratory specimens during the acute phase of infection. Positive results are indicative of the presence of the identified virus, but do not rule out bacterial infection or co-infection with other pathogens not detected by the test. Clinical correlation with patient history and other diagnostic information is necessary to determine patient infection status. The expected result is Negative.  Fact Sheet for Patients: EntrepreneurPulse.com.au  Fact Sheet for Healthcare Providers: IncredibleEmployment.be  This test is not yet approved or  cleared by the [place] and  has been authorized for detection and/or diagnosis of SARS-CoV-2 by FDA under an Emergency Use Authorization (EUA).  This EUA will remain in effect (meaning this test can b e used) for the duration of  the COVID-19 declaration under Section 564(b)(1) of the Act, 21 U.S.C. section 360bbb-3(b)(1), unless the authorization is terminated or revoked sooner.     Influenza A by PCR NEGATIVE NEGATIVE Final   Influenza B by PCR NEGATIVE NEGATIVE Final    Comment: (NOTE) The Xpert Xpress SARS-CoV-2/FLU/RSV plus assay is intended as an aid in the diagnosis of influenza from Nasopharyngeal swab specimens and should not be used as a sole basis for treatment. Nasal washings and aspirates are unacceptable for Xpert Xpress SARS-CoV-2/FLU/RSV testing.  Fact Sheet for Patients: EntrepreneurPulse.com.au  Fact Sheet for  Healthcare Providers: IncredibleEmployment.be  This test is not yet approved or cleared by the [place] FDA and has been authorized for detection and/or diagnosis of SARS-CoV-2 by FDA under an Emergency Use Authorization (EUA). This EUA will remain in effect (meaning this test can be used) for the duration of the COVID-19 declaration under Section 564(b)(1) of the Act, 21 U.S.C. section 360bbb-3(b)(1), unless the authorization is terminated or revoked.  Performed at [facility], [address], [place], [place] [address]   MRSA PCR Screening     Status: None   Collection Time: 01/22/21  4:14 AM   Specimen: Nasal Mucosa; Nasopharyngeal  Result Value Ref Range Status   MRSA by PCR NEGATIVE NEGATIVE Final    Comment:        The GeneXpert MRSA Assay (FDA approved for NASAL specimens only), is one component of a comprehensive MRSA colonization surveillance program. It is not intended to diagnose MRSA infection nor to guide or monitor treatment for MRSA infections. Performed at [facility], [address] [place], [place] [address]          Radiology Studies: CT Head Wo Contrast  Result Date: 01/21/2021 CLINICAL DATA:  Syncope. EXAM: CT HEAD WITHOUT CONTRAST TECHNIQUE: Contiguous axial images were obtained from the base of the skull through the vertex without intravenous contrast. COMPARISON:  December 01, 2017 FINDINGS: Brain: There is mild cerebral atrophy with widening of the extra-axial spaces and ventricular dilatation. There are areas of decreased attenuation within the white matter tracts of the supratentorial brain, consistent with microvascular disease changes. Vascular: No hyperdense vessel or unexpected calcification. Skull: Normal. Negative for fracture or focal lesion. Sinuses/Orbits: No acute finding. Other: None. IMPRESSION: 1. Generalized cerebral atrophy. 2. No acute intracranial abnormality. Electronically Signed   By: Virgina Norfolk M.D.    On: 01/21/2021 23:53   US Carotid Bilateral  Result Date: 01/22/2021 CLINICAL DATA:  Syncope, hypertension, diabetes EXAM: BILATERAL CAROTID DUPLEX ULTRASOUND TECHNIQUE: Pearline Cables scale imaging, color Doppler and duplex ultrasound were performed of bilateral carotid and vertebral arteries in the neck. COMPARISON:  None. FINDINGS: Criteria: Quantification of carotid stenosis is based on velocity parameters that correlate the residual internal carotid diameter with NASCET-based stenosis levels, using the diameter of the distal internal carotid lumen as the denominator for stenosis measurement. The following velocity measurements were obtained: RIGHT ICA: 88/31 cm/sec CCA: 78/46 cm/sec SYSTOLIC ICA/CCA RATIO:  1.0 ECA: 117 cm/sec LEFT ICA: 98/33 cm/sec CCA: 96/29 cm/sec SYSTOLIC ICA/CCA RATIO:  1.3 ECA: 88 cm/sec RIGHT CAROTID ARTERY: Minor echogenic shadowing plaque formation. No hemodynamically significant right ICA stenosis, velocity elevation, or turbulent flow. Degree of narrowing less than 50%. RIGHT VERTEBRAL  ARTERY:  Normal antegrade flow LEFT CAROTID ARTERY: Similar scattered minor echogenic plaque formation. No hemodynamically significant left ICA stenosis, velocity elevation, or turbulent flow. LEFT VERTEBRAL ARTERY:  Normal antegrade flow IMPRESSION: Minor carotid atherosclerosis. No hemodynamically significant ICA stenosis. Degree of narrowing less than 50% bilaterally by ultrasound criteria. Patent antegrade vertebral flow bilaterally Electronically Signed   By: Jerilynn Mages.  Shick M.D.   On: 01/22/2021 10:41   ECHOCARDIOGRAM LIMITED  Result Date: 01/22/2021    ECHOCARDIOGRAM LIMITED REPORT   Patient Name:   Laurie Larson Date of Exam: 01/22/2021 Medical Rec #:  [id number]             Height:       65.0 in Accession #:    [id number]            Weight:       141.1 lb Date of Birth:  10-12-1961            BSA:          1.706 m Patient Age:    68 years              BP:           106/57 mmHg Patient Gender: F                      HR:           69 bpm. Exam Location:  [facility] Procedure: Cardiac Doppler, Color Doppler and Limited Echo Indications:    Syncope  History:        Patient has no prior history of Echocardiogram examinations.                 Signs/Symptoms:Syncope and Hypotension; Risk Factors:Diabetes.                 COVID+, GI bleed.  Sonographer:    Dustin Flock RDCS Referring Phys: [id number] OLADAPO ADEFESO IMPRESSIONS  1. Left ventricular ejection fraction, by estimation, is 65 to 70%. The left ventricle has normal function. The left ventricle has no regional wall motion abnormalities.  2. Right ventricular systolic function is normal. The right ventricular size is normal. There is normal pulmonary artery systolic pressure.  3. The mitral valve is normal in structure. Trivial mitral valve regurgitation. No evidence of mitral stenosis.  4. The aortic valve is normal in structure. Aortic valve regurgitation is not visualized. No aortic stenosis is present.  5. The inferior vena cava is normal in size with greater than 50% respiratory variability, suggesting right atrial pressure of 3 mmHg. FINDINGS  Left Ventricle: Left ventricular ejection fraction, by estimation, is 65 to 70%. The left ventricle has normal function. The left ventricle has no regional wall motion abnormalities. The left ventricular internal cavity size was normal in size. There is  no concentric left ventricular hypertrophy. Right Ventricle: The right ventricular size is normal. No increase in right ventricular wall thickness. Right ventricular systolic function is normal. There is normal pulmonary artery systolic pressure. The tricuspid regurgitant velocity is 1.71 m/s, and  with an assumed right atrial pressure of 3 mmHg, the estimated right ventricular systolic pressure is 75.1 mmHg. Left Atrium: Left atrial size was normal in size. Right Atrium: Right atrial size was normal in size. Pericardium: There is no evidence of pericardial  effusion. Mitral Valve: The mitral valve is normal in structure. Trivial mitral valve regurgitation. No evidence of mitral valve stenosis. Tricuspid Valve: The tricuspid valve is normal in  structure. Tricuspid valve regurgitation is trivial. No evidence of tricuspid stenosis. Aortic Valve: The aortic valve is normal in structure. Aortic valve regurgitation is not visualized. No aortic stenosis is present. Aortic valve peak gradient measures 13.8 mmHg. Pulmonic Valve: The pulmonic valve was normal in structure. Pulmonic valve regurgitation is not visualized. No evidence of pulmonic stenosis. Aorta: The aortic root is normal in size and structure. Venous: The inferior vena cava is normal in size with greater than 50% respiratory variability, suggesting right atrial pressure of 3 mmHg. IAS/Shunts: No atrial level shunt detected by color flow Doppler. LEFT VENTRICLE PLAX 2D LVIDd:         4.65 cm  Diastology LVIDs:         2.79 cm  LV e' medial:    9.68 cm/s LV PW:         1.06 cm  LV E/e' medial:  9.7 LV IVS:        1.02 cm  LV e' lateral:   11.00 cm/s LVOT diam:     2.00 cm  LV E/e' lateral: 8.5 LVOT Area:     3.14 cm  RIGHT VENTRICLE RV S prime:     15.10 cm/s LEFT ATRIUM         Index LA diam:    2.60 cm 1.52 cm/m  AORTIC VALVE AV Vmax:      186.00 cm/s AV Peak Grad: 13.8 mmHg  AORTA Ao Root diam: 2.80 cm MITRAL VALVE               TRICUSPID VALVE MV Area (PHT): 5.38 cm    TR Peak grad:   11.7 mmHg MV Decel Time: 141 msec    TR Vmax:        171.00 cm/s MV E velocity: 93.80 cm/s MV A velocity: 62.10 cm/s  SHUNTS MV E/A ratio:  1.51        Systemic Diam: 2.00 cm Skeet Latch md Electronically signed by Skeet Latch md Signature Date/Time: 01/22/2021/1:49:13 PM    Final         Scheduled Meds: . amLODipine  2.5 mg Oral Daily  . vitamin C  500 mg Oral Daily  . Chlorhexidine Gluconate Cloth  6 each Topical Daily  . feeding supplement (GLUCERNA SHAKE)  237 mL Oral TID BM  . hydrocortisone   Rectal  BID  . insulin pump   Subcutaneous TID WC, HS, 0200  . losartan  100 mg Oral Daily  . zinc sulfate  220 mg Oral Daily   Continuous Infusions: . sodium chloride 125 mL/hr at 01/23/21 1016  . pantoprozole (PROTONIX) infusion 8 mg/hr (01/23/21 0215)     LOS: 1 day    Time spent: 35 minutes    Almira Phetteplace D Manuella Ghazi, DO [facility]  If 7PM-7AM, please contact night-coverage [email or web address] 01/23/2021, 11:17 AM

## 2021-01-23 NOTE — Progress Notes (Signed)
Report called and given to [facility], LPN on [phone].

## 2021-01-23 NOTE — Progress Notes (Addendum)
Inpatient Diabetes Program Recommendations  AACE/ADA: New Consensus Statement on Inpatient Glycemic Control   Target Ranges:  Prepandial:   less than 140 mg/dL      Peak postprandial:   less than 180 mg/dL (1-2 hours)      Critically ill patients:  140 - 180 mg/dL   Results for Laurie Larson, Laurie Larson (MRN [id number]) as of 01/23/2021 07:10  Ref. Range 01/22/2021 00:43 01/22/2021 08:33 01/22/2021 17:00 01/22/2021 22:11  Glucose-Capillary Latest Ref Range: 70 - 99 mg/dL 324 (H) 245 (H) 203 (H) 170 (H)  Results for Laurie Larson, Laurie Larson (MRN [id number]) as of 01/23/2021 07:10  Ref. Range 01/21/2021 21:10 01/21/2021 22:26  Glucose-Capillary Latest Ref Range: 70 - 99 mg/dL 366 (H) 341 (H)   Review of Glycemic Control  Diabetes history: DM1 (makes NO insulin; requires basal, correction, and carbohydrate coverage insulin) Outpatient Diabetes medications: Medtronic Insulin Pump with Novolog, Metformin 500 mg BID, Levemir 10 units QHS (for basal when not using pump) Current orders for Inpatient glycemic control: Novolog 0-9 units TID with meals, Novolog 0-5 units QHS  Inpatient Diabetes Program Recommendations:    Insulin: Please consider discontinuing Novolog correction scale and order Insulin Pump order set with CBGs AC&HS, and 2am.  NOTE: Per chart review, patient has Type 1 DM and uses an insulin pump for DM management. Noted patient sees Dr. Garrel Ridgel with [facility] (last visit 12/28/20). Per note on 12/28/20, the following should be insulin pump settings:  Basal 0.3 units for 24 hours (7.2 units per 24 hours for basal)  Insulin to Carb Ratio 12A 1:9 (1 unit for every 9 grams of carbs) 12P 1:10 (I unit for every 10 grams of carbs)  Insulin Sensitivity 1:80 (1 unit drops glucose 80 mg/dl)  Target Glucose  120 mg/dl  Per H&P, patient admitted with syncopal episode at home with complains of nausea, vomiting, generalized weakness and several loose black stools. Patient also reported glucose  running high and having symptoms of hyperglycemia. Admitted with GI bleed with symptomatic anemia, syncope, dehydration, hyperglycemia and incidentally found to be COVID positive. Current A1C 6.1% on 01/21/21 indicating an average glucose of 128 mg/dl.  Per chart, patient is currently using her insulin pump for DM management as an inpatient.  Addendum 01/23/21@14 :09- Diabetes coordinator working remotely. Called patient's room and patient's husband answered the phone and said that the patient was busy talking with nursing staff. [name] reports that he is knowledgeable about patient's insulin pump and he notes that patient recently had pump changes from [employer] and he confirms that her pump settings were as noted above. He states that patient is using Medtronic continuous glucose monitoring sensor and that patient's glucose normally runs fairly well. He notes that patient is having some issues with lows at night but states that patient wakes up as she is able to tell her glucose is low. Informed patient's husband that while patient is in the hospital,her glucose will be checked AC&HS, and 2am and diabetes coordinator will continue to follow along while inpatient.   Thanks, Barnie Alderman, RN, MSN, CDE Diabetes Coordinator Inpatient Diabetes Program [phone] (Team Pager from 8am to 5pm)

## 2021-01-24 ENCOUNTER — Telehealth: Payer: Self-pay | Admitting: Gastroenterology

## 2021-01-24 ENCOUNTER — Encounter (HOSPITAL_COMMUNITY): Payer: Self-pay | Admitting: Gastroenterology

## 2021-01-24 DIAGNOSIS — K269 Duodenal ulcer, unspecified as acute or chronic, without hemorrhage or perforation: Secondary | ICD-10-CM | POA: Diagnosis not present

## 2021-01-24 DIAGNOSIS — K264 Chronic or unspecified duodenal ulcer with hemorrhage: Secondary | ICD-10-CM

## 2021-01-24 LAB — BASIC METABOLIC PANEL
Anion gap: 10 (ref 5–15)
BUN: <5 mg/dL — ABNORMAL LOW (ref 6–20)
CO2: 25 mmol/L (ref 22–32)
Calcium: 8.1 mg/dL — ABNORMAL LOW (ref 8.9–10.3)
Chloride: 106 mmol/L (ref 98–111)
Creatinine, Ser: 0.57 mg/dL (ref 0.44–1.00)
GFR, Estimated: >60 mL/min (ref >60)
Glucose, Bld: 155 mg/dL — ABNORMAL HIGH (ref 70–99)
Potassium: 3.2 mmol/L — ABNORMAL LOW (ref 3.5–5.1)
Sodium: 141 mmol/L (ref 135–145)

## 2021-01-24 LAB — CBC
HCT: 27 % — ABNORMAL LOW (ref 36.0–46.0)
Hemoglobin: 9.2 g/dL — ABNORMAL LOW (ref 12.0–15.0)
MCH: 33.2 pg (ref 26.0–34.0)
MCHC: 34.1 g/dL (ref 30.0–36.0)
MCV: 97.5 fL (ref 80.0–100.0)
Platelets: 127 10*3/uL — ABNORMAL LOW (ref 150–400)
RBC: 2.77 MIL/uL — ABNORMAL LOW (ref 3.87–5.11)
RDW: 15.1 % (ref 11.5–15.5)
WBC: 4.4 10*3/uL (ref 4.0–10.5)
nRBC: 0 % (ref 0.0–0.2)

## 2021-01-24 LAB — GLUCOSE, CAPILLARY
Glucose-Capillary: 114 mg/dL — ABNORMAL HIGH (ref 70–99)
Glucose-Capillary: 162 mg/dL — ABNORMAL HIGH (ref 70–99)
Glucose-Capillary: 186 mg/dL — ABNORMAL HIGH (ref 70–99)
Glucose-Capillary: 216 mg/dL — ABNORMAL HIGH (ref 70–99)
Glucose-Capillary: 197 mg/dL — ABNORMAL HIGH (ref 70–99)

## 2021-01-24 LAB — SURGICAL PATHOLOGY

## 2021-01-24 LAB — H. PYLORI ANTIBODY, IGG: H Pylori IgG: 0.16 Index Value (ref 0.00–0.79)

## 2021-01-24 LAB — MAGNESIUM: Magnesium: 1.4 mg/dL — ABNORMAL LOW (ref 1.7–2.4)

## 2021-01-24 MED ORDER — MAGNESIUM SULFATE 4 GM/100ML IV SOLN
4.0000 g | Freq: Once | INTRAVENOUS | Status: AC
  Administered 2021-01-24: 4 g via INTRAVENOUS
  Filled 2021-01-24: qty 100

## 2021-01-24 MED ORDER — POTASSIUM CHLORIDE CRYS ER 20 MEQ PO TBCR
40.0000 meq | EXTENDED_RELEASE_TABLET | Freq: Two times a day (BID) | ORAL | Status: AC
  Administered 2021-01-24 (×2): 40 meq via ORAL
  Filled 2021-01-24 (×2): qty 2

## 2021-01-24 NOTE — Progress Notes (Signed)
PROGRESS NOTE    Laurie Larson  ZJQ:[id number] DOB: 12/26/1960 DOA: 01/21/2021 PCP: Dineen Kid, MD   Brief Narrative:   Laurie Larson a 60 y.o.femalewith medical history significant forT1DMon insulin pump, hypertension, GERD to the emergency department accompanied by her husband due to having a syncopal episode at home, she complained of nausea, vomiting, generalized weakness and several loose black stools. Patient states that herblood glucose level was running high today to greater than 500s,she also complained of frequent urination, generalized weakness, fatigue, lightheadedness and dizziness.She was admitted with symptomatic anemia in the setting of GI bleed. She has undergone 2 unit PRBC transfusion on 3/13 with improvement of hemoglobin level noted and stable vital signs. She has undergone EGD with findings of nonbleeding ulcers in the antrum of the stomach as well as a cratered duodenal ulcer with visible vessel status post cauterization. She needs to remain on her PPI infusion through tonight to complete her 72 hours of treatment.  Assessment & Plan:   Principal Problem:   GI bleed Active Problems:   Syncope and collapse   Nausea & vomiting   Diarrhea   Symptomatic anemia   Macrocytic anemia   COVID-19 virus infection   Hypoalbuminemia   Orthostatic hypotension   Dehydration   Hyperglycemia due to type 2 diabetes mellitus (HCC)   Symptomatic acute blood loss anemia-stable -Status post 2 unit PRBC transfusion on admission -Continue to follow CBC -Status post EGD 3/13 with oozing duodenal ulcer status post ablation -Continue soft diet -PPI drip needs to continue for total 72 hours  Syncope and collapse secondary to above -Carotid ultrasound with no significant findings -2D echocardiogram with LVEF 65 to 70% with no valvular findings  Hypomagnesemia/hypokalemia -Replete and reevaluate in a.m.  Hyperglycemia in setting of type 1  diabetes-improved -Insulin pump -SSI  Macrocytic anemia-stable -Continue to monitor closely -W40 and folic acid levels within normal limits  Essential hypertension-stable -Blood pressure is beginning to elevate, continue home losartan and amlodipine  Incidental COVID-19 infection -Asymptomatic, no need for remdesivir or steroids -Isolation precautions -Vitamin C and zinc  GERD -IV Protonix drip to continue for total 72 hours through midnight 3/15   DVT prophylaxis:SCDs Code Status: Full Family Communication: Discussed with husband 3/15 Disposition Plan:  Status is: Inpatient  Remains inpatient appropriate because:IV treatments appropriate due to intensity of illness or inability to take PO and Inpatient level of care appropriate due to severity of illness   Dispo: The patient is from: Home  Anticipated d/c is to: Home  Patient currently is not medically stable to d/c.              Difficult to place patient No   Consultants:   GI  Procedures:   EGD 3/13  Antimicrobials:   None  Subjective: Patient seen and evaluated today with no new acute complaints or concerns. No acute concerns or events noted overnight.  No bloody bowel movements noted or abdominal pain.  Objective: Vitals:   01/23/21 2205 01/24/21 0215 01/24/21 0500 01/24/21 1019  BP: (!) 147/89 (!) 164/84 (!) 147/76 (!) 150/95  Pulse: 72   70  Resp: 18 16 19 16   Temp: 98.7 F (37.1 C) 98.3 F (36.8 C) 98.3 F (36.8 C)   TempSrc: Oral Oral Oral   SpO2: 99% 99% 99% 99%  Weight:      Height:        Intake/Output Summary (Last 24 hours) at 01/24/2021 1403 Last data filed at 01/24/2021 0830 Gross per 24 hour  Intake 2665.83 ml  Output --  Net 2665.83 ml   Filed Weights   01/21/21 2106 01/22/21 0416  Weight: 57.2 kg 64 kg    Examination:  General exam: Appears calm and comfortable  Respiratory system: Clear to auscultation. Respiratory effort  normal. Cardiovascular system: S1 & S2 heard, RRR.  Gastrointestinal system: Abdomen is soft Central nervous system: Alert and awake Extremities: No edema Skin: No significant lesions noted Psychiatry: Flat affect.    Data Reviewed: I have personally reviewed following labs and imaging studies  CBC: Recent Labs  Lab 01/21/21 2155 01/22/21 1219 01/23/21 0436 01/24/21 0635  WBC 11.2* 8.5 5.0 4.4  NEUTROABS 10.3*  --   --   --   HGB 7.4* 8.7* 8.2* 9.2*  HCT 23.2* 26.1* 24.7* 27.0*  MCV 105.0* 97.8 97.2 97.5  PLT 146* 118* 110* 258*   Basic Metabolic Panel: Recent Labs  Lab 01/21/21 2155 01/22/21 1219 01/23/21 0436 01/24/21 0635  NA 133* 136 140 141  K 5.1 3.7 3.6 3.2*  CL 103 108 112* 106  CO2 20* 20* 21* 25  GLUCOSE 422* 274* 159* 155*  BUN 26* 16 7 <5*  CREATININE 0.80 0.64 0.55 0.57  CALCIUM 7.5* 7.5* 8.0* 8.1*  MG  --  1.4* 1.3* 1.4*  PHOS  --  2.4*  --   --    GFR: Estimated Creatinine Clearance: 69.8 mL/min (by C-G formula based on SCr of 0.57 mg/dL). Liver Function Tests: Recent Labs  Lab 01/21/21 2155 01/22/21 1219 01/23/21 0436  AST 13* 15 15  ALT 13 12 13   ALKPHOS 65 56 49  BILITOT 0.7 0.9 0.5  PROT 4.9* 4.6* 4.5*  ALBUMIN 2.6* 2.5* 2.4*   No results for input(s): LIPASE, AMYLASE in the last 168 hours. No results for input(s): AMMONIA in the last 168 hours. Coagulation Profile: Recent Labs  Lab 01/21/21 2155  INR 1.2   Cardiac Enzymes: No results for input(s): CKTOTAL, CKMB, CKMBINDEX, TROPONINI in the last 168 hours. BNP (last 3 results) No results for input(s): PROBNP in the last 8760 hours. HbA1C: No results for input(s): HGBA1C in the last 72 hours. CBG: Recent Labs  Lab 01/23/21 1653 01/23/21 2202 01/24/21 0211 01/24/21 0757 01/24/21 1239  GLUCAP 123* 73 114* 162* 186*   Lipid Profile: No results for input(s): CHOL, HDL, LDLCALC, TRIG, CHOLHDL, LDLDIRECT in the last 72 hours. Thyroid Function Tests: No results for  input(s): TSH, T4TOTAL, FREET4, T3FREE, THYROIDAB in the last 72 hours. Anemia Panel: Recent Labs    01/22/21 1219  VITAMINB12 483  FOLATE 16.1   Sepsis Labs: No results for input(s): PROCALCITON, LATICACIDVEN in the last 168 hours.  Recent Results (from the past 240 hour(s))  Resp Panel by RT-PCR (Flu A&B, Covid) Nasopharyngeal Swab     Status: Abnormal   Collection Time: 01/22/21 12:40 AM   Specimen: Nasopharyngeal Swab; Nasopharyngeal(NP) swabs in vial transport medium  Result Value Ref Range Status   SARS Coronavirus 2 by RT PCR POSITIVE (A) NEGATIVE Final    Comment: RESULT CALLED TO, READ BACK BY AND VERIFIED WITH: SAPPELT,J @ 0152 ON 01/22/21 BY JUW (NOTE) SARS-CoV-2 target nucleic acids are DETECTED.  The SARS-CoV-2 RNA is generally detectable in upper respiratory specimens during the acute phase of infection. Positive results are indicative of the presence of the identified virus, but do not rule out bacterial infection or co-infection with other pathogens not detected by the test. Clinical correlation with patient history and other diagnostic information is necessary  to determine patient infection status. The expected result is Negative.  Fact Sheet for Patients: EntrepreneurPulse.com.au  Fact Sheet for Healthcare Providers: IncredibleEmployment.be  This test is not yet approved or cleared by the [place] FDA and  has been authorized for detection and/or diagnosis of SARS-CoV-2 by FDA under an Emergency Use Authorization (EUA).  This EUA will remain in effect (meaning this test can b e used) for the duration of  the COVID-19 declaration under Section 564(b)(1) of the Act, 21 U.S.C. section 360bbb-3(b)(1), unless the authorization is terminated or revoked sooner.     Influenza A by PCR NEGATIVE NEGATIVE Final   Influenza B by PCR NEGATIVE NEGATIVE Final    Comment: (NOTE) The Xpert Xpress SARS-CoV-2/FLU/RSV plus assay is  intended as an aid in the diagnosis of influenza from Nasopharyngeal swab specimens and should not be used as a sole basis for treatment. Nasal washings and aspirates are unacceptable for Xpert Xpress SARS-CoV-2/FLU/RSV testing.  Fact Sheet for Patients: EntrepreneurPulse.com.au  Fact Sheet for Healthcare Providers: IncredibleEmployment.be  This test is not yet approved or cleared by the [place] FDA and has been authorized for detection and/or diagnosis of SARS-CoV-2 by FDA under an Emergency Use Authorization (EUA). This EUA will remain in effect (meaning this test can be used) for the duration of the COVID-19 declaration under Section 564(b)(1) of the Act, 21 U.S.C. section 360bbb-3(b)(1), unless the authorization is terminated or revoked.  Performed at [facility], [address] [place], [place] [address]   MRSA PCR Screening     Status: None   Collection Time: 01/22/21  4:14 AM   Specimen: Nasal Mucosa; Nasopharyngeal  Result Value Ref Range Status   MRSA by PCR NEGATIVE NEGATIVE Final    Comment:        The GeneXpert MRSA Assay (FDA approved for NASAL specimens only), is one component of a comprehensive MRSA colonization surveillance program. It is not intended to diagnose MRSA infection nor to guide or monitor treatment for MRSA infections. Performed at [facility], [address] [place], [place] [address]          Radiology Studies: No results found.      Scheduled Meds: . amLODipine  2.5 mg Oral Daily  . vitamin C  500 mg Oral Daily  . Chlorhexidine Gluconate Cloth  6 each Topical Daily  . feeding supplement (GLUCERNA SHAKE)  237 mL Oral TID BM  . hydrocortisone   Rectal BID  . insulin pump   Subcutaneous TID WC, HS, 0200  . losartan  100 mg Oral Daily  . potassium chloride  40 mEq Oral BID  . zinc sulfate  220 mg Oral Daily   Continuous Infusions: . sodium chloride 125 mL/hr at 01/24/21 1121  .  pantoprozole (PROTONIX) infusion 8 mg/hr (01/24/21 0814)     LOS: 2 days    Time spent: 35 minutes    Melanye Hiraldo D Manuella Ghazi, DO [facility]  If 7PM-7AM, please contact night-coverage [email or web address] 01/24/2021, 2:03 PM

## 2021-01-24 NOTE — Progress Notes (Addendum)
    Subjective: Feeling well. Eager to go home. No overt GI bleeding in >24 hours. No abdominal pain, nausea, or vomiting. Small BM earlier today.   Patient reports allergy to acetaminophen. Reports anaphylaxis around age 60.   Objective: Vital signs in last 24 hours: Temp:  [98.3 F (36.8 C)-98.8 F (37.1 C)] 98.3 F (36.8 C) (03/15 0500) Pulse Rate:  [65-72] 70 (03/15 1019) Resp:  [16-20] 16 (03/15 1019) BP: (147-164)/(73-96) 150/95 (03/15 1019) SpO2:  [99 %-100 %] 99 % (03/15 1019) Last BM Date: 01/23/21 General:   Alert and oriented, pleasant, well developed, and well nourished.  Head:  Normocephalic and atraumatic. Eyes:  No icterus, sclera clear. Conjuctiva pink.  Abdomen:  Bowel sounds present, soft, non-tender, non-distended. No HSM or hernias noted. No rebound or guarding. No masses appreciated  Msk:  Symmetrical without gross deformities. Normal posture. Extremities:  Without edema. Neurologic:  Alert and  oriented x4;  grossly normal neurologically. Psych: Normal mood and affect.  Intake/Output from previous day: 03/14 0701 - 03/15 0700 In: 2425.8 [P.O.:120; I.V.:2305.8] Out: -  Intake/Output this shift: Total I/O In: 240 [P.O.:240] Out: -   Lab Results: Recent Labs    01/22/21 1219 01/23/21 0436 01/24/21 0635  WBC 8.5 5.0 4.4  HGB 8.7* 8.2* 9.2*  HCT 26.1* 24.7* 27.0*  PLT 118* 110* 127*   BMET Recent Labs    01/22/21 1219 01/23/21 0436 01/24/21 0635  NA 136 140 141  K 3.7 3.6 3.2*  CL 108 112* 106  CO2 20* 21* 25  GLUCOSE 274* 159* 155*  BUN 16 7 <5*  CREATININE 0.64 0.55 0.57  CALCIUM 7.5* 8.0* 8.1*   LFT Recent Labs    01/21/21 2155 01/22/21 1219 01/23/21 0436  PROT 4.9* 4.6* 4.5*  ALBUMIN 2.6* 2.5* 2.4*  AST 13* 15 15  ALT 13 12 13   ALKPHOS 65 56 49  BILITOT 0.7 0.9 0.5   PT/INR Recent Labs    01/21/21 2155  LABPROT 14.6  INR 1.2    Assessment: 60 year old very pleasant female admitted with melena and symptomatic  anemia due to 1 cm cratered duodenal ulcer with mild oozing along edges of ulcer and presence of single vessel in middle of ulcer, s/p epi and thermal ablation. Felt likely due to NSAIDs. H.pylori serology is pending.  Surgical pathology with gastric antral mucosa with reactive gastropathy and intestinal metaplasia, negative for dysplasia, mild chronic gastritis, negative for H. Pylori.  Hemoglobin on admission was 7.4.  She received 2 units PRBCs.  Hemoglobin improved to 9.2 today. No overt GI bleeding in > 24 hours. Still on PPI infusion which will be complete at midnight.   Plan: 1.  Complete PPI infusion total of 72 hours (started evening of 3/12). 2.  She will need to start Protonix 40 mg BID tomorrow.  3.  Avoid NSAIDs.  4.  Follow-up on H pylori serology.  5.  Continue soft diet for now.  6.  Monitor for overt GI bleeding and follow H/H.  7.  Colonoscopy as outpatient.  8.  Patient desires to follow-up with Dr. Jenetta Downer as outpatient 9.  Anticipate discharge tomorrow am. Patient is eager for discharge as early as possible.    LOS: 2 days    01/24/2021, 12:35 PM   Laurie Larson, [facility]

## 2021-01-24 NOTE — Plan of Care (Signed)
Alert and and oriented x 4. Denies pain or discomfort at this time. Continues on IV pantaprazole drip and IV fluids and tolerating well. On home Insulin pump and managing it herself. Glucose checks done per orders. Continues on airborne/contact isolation for covid 19. Will continue plan of care.  Problem: Education: Goal: Knowledge of General Education information will improve Description: Including pain rating scale, medication(s)/side effects and non-pharmacologic comfort measures Outcome: Progressing   Problem: Health Behavior/Discharge Planning: Goal: Ability to manage health-related needs will improve Outcome: Progressing   Problem: Clinical Measurements: Goal: Ability to maintain clinical measurements within normal limits will improve Outcome: Progressing Goal: Will remain free from infection Outcome: Progressing Goal: Diagnostic test results will improve Outcome: Progressing Goal: Respiratory complications will improve Outcome: Progressing Goal: Cardiovascular complication will be avoided Outcome: Progressing   Problem: Activity: Goal: Risk for activity intolerance will decrease Outcome: Progressing   Problem: Nutrition: Goal: Adequate nutrition will be maintained Outcome: Progressing   Problem: Coping: Goal: Level of anxiety will decrease Outcome: Progressing   Problem: Elimination: Goal: Will not experience complications related to bowel motility Outcome: Progressing Goal: Will not experience complications related to urinary retention Outcome: Progressing   Problem: Pain Managment: Goal: General experience of comfort will improve Outcome: Progressing   Problem: Safety: Goal: Ability to remain free from injury will improve Outcome: Progressing   Problem: Skin Integrity: Goal: Risk for impaired skin integrity will decrease Outcome: Progressing

## 2021-01-24 NOTE — Telephone Encounter (Signed)
Laurie Larson, patient desires to follow-up with Dr. Jenetta Downer as an outpatient. She was not established with a GI recently but did have a colonoscopy by Dr. Paulita Fujita in 2014. Is it ok to follow-up with Dr. Jenetta Downer? Patient wanted to keep care locally. Currently inpatient for duodenal ulcer with hopeful discharge soon.

## 2021-01-24 NOTE — Telephone Encounter (Signed)
Yes she can follow-up with him no problems --when does she need follow up?  Just let me know and I will give to scheduler at [place]

## 2021-01-25 DIAGNOSIS — R55 Syncope and collapse: Secondary | ICD-10-CM | POA: Diagnosis not present

## 2021-01-25 DIAGNOSIS — E1165 Type 2 diabetes mellitus with hyperglycemia: Secondary | ICD-10-CM | POA: Diagnosis not present

## 2021-01-25 DIAGNOSIS — E860 Dehydration: Secondary | ICD-10-CM | POA: Diagnosis not present

## 2021-01-25 DIAGNOSIS — K3189 Other diseases of stomach and duodenum: Secondary | ICD-10-CM

## 2021-01-25 DIAGNOSIS — K264 Chronic or unspecified duodenal ulcer with hemorrhage: Secondary | ICD-10-CM | POA: Diagnosis not present

## 2021-01-25 LAB — CBC
HCT: 27.7 % — ABNORMAL LOW (ref 36.0–46.0)
Hemoglobin: 9.2 g/dL — ABNORMAL LOW (ref 12.0–15.0)
MCH: 32.2 pg (ref 26.0–34.0)
MCHC: 33.2 g/dL (ref 30.0–36.0)
MCV: 96.9 fL (ref 80.0–100.0)
Platelets: 150 10*3/uL (ref 150–400)
RBC: 2.86 MIL/uL — ABNORMAL LOW (ref 3.87–5.11)
RDW: 14.4 % (ref 11.5–15.5)
WBC: 5.2 10*3/uL (ref 4.0–10.5)
nRBC: 0 % (ref 0.0–0.2)

## 2021-01-25 LAB — BASIC METABOLIC PANEL
Anion gap: 9 (ref 5–15)
BUN: 6 mg/dL (ref 6–20)
CO2: 23 mmol/L (ref 22–32)
Calcium: 8.5 mg/dL — ABNORMAL LOW (ref 8.9–10.3)
Chloride: 105 mmol/L (ref 98–111)
Creatinine, Ser: 0.59 mg/dL (ref 0.44–1.00)
GFR, Estimated: >60 mL/min (ref >60)
Glucose, Bld: 233 mg/dL — ABNORMAL HIGH (ref 70–99)
Potassium: 3.8 mmol/L (ref 3.5–5.1)
Sodium: 137 mmol/L (ref 135–145)

## 2021-01-25 LAB — GLUCOSE, CAPILLARY
Glucose-Capillary: 228 mg/dL — ABNORMAL HIGH (ref 70–99)
Glucose-Capillary: 271 mg/dL — ABNORMAL HIGH (ref 70–99)

## 2021-01-25 LAB — MAGNESIUM: Magnesium: 1.5 mg/dL — ABNORMAL LOW (ref 1.7–2.4)

## 2021-01-25 MED ORDER — MAGNESIUM SULFATE 4 GM/100ML IV SOLN: 4.0000 g | Freq: Once | INTRAVENOUS | Status: DC

## 2021-01-25 MED ORDER — OMEPRAZOLE 40 MG PO CPDR: 40.0000 mg | DELAYED_RELEASE_CAPSULE | Freq: Two times a day (BID) | ORAL | 2 refills | Status: AC

## 2021-01-25 NOTE — Progress Notes (Signed)
When staff entered room, patient began voicing complaints of incorrect breakfast tray, feelings of being "unsafe" related to continued high readings of high CBG. Dr. Wynetta Emery was made aware, but patient requested to leave AMA prior to speaking with him. AMA paper signed, IV removed. Patient left unit ambulatory.

## 2021-01-25 NOTE — Plan of Care (Signed)
Alert and oriented x 4. Denies pain or discomfort. Completed pantoprazole infusion at midnight. IV fluids still infusing. No s/s of bleeding. Continues on isolation for covid-19. Continue plan of care.  Problem: Education: Goal: Knowledge of General Education information will improve Description: Including pain rating scale, medication(s)/side effects and non-pharmacologic comfort measures Outcome: Progressing   Problem: Health Behavior/Discharge Planning: Goal: Ability to manage health-related needs will improve Outcome: Progressing   Problem: Clinical Measurements: Goal: Ability to maintain clinical measurements within normal limits will improve Outcome: Progressing Goal: Will remain free from infection Outcome: Progressing Goal: Diagnostic test results will improve Outcome: Progressing Goal: Respiratory complications will improve Outcome: Progressing Goal: Cardiovascular complication will be avoided Outcome: Progressing   Problem: Activity: Goal: Risk for activity intolerance will decrease Outcome: Progressing   Problem: Nutrition: Goal: Adequate nutrition will be maintained Outcome: Progressing   Problem: Coping: Goal: Level of anxiety will decrease Outcome: Progressing   Problem: Elimination: Goal: Will not experience complications related to bowel motility Outcome: Progressing Goal: Will not experience complications related to urinary retention Outcome: Progressing   Problem: Pain Managment: Goal: General experience of comfort will improve Outcome: Progressing   Problem: Safety: Goal: Ability to remain free from injury will improve Outcome: Progressing   Problem: Skin Integrity: Goal: Risk for impaired skin integrity will decrease Outcome: Progressing

## 2021-01-25 NOTE — Discharge Summary (Addendum)
Physician Discharge Summary  Ren Grasse WSF:[id number] DOB: 1961/06/05 DOA: 01/21/2021  PCP: Dineen Kid, MD GI: Dr. Jenetta Downer  Admit date: 01/21/2021 Discharge date: 01/25/2021  Admitted From:  Home  Disposition: Home   Pt has discharged against medical advice Pt discharged prior to receiving IV Mg, discharge Rxs.    Recommendations for Outpatient Follow-up:  1. Follow up with PCP in 1 weeks 2. Please obtain BMP/CBC in one week 3. Please follow up on the following pending results:  Discharge Condition: STABLE   CODE STATUS: FULL  Diet: resume carb modified diet    Brief Hospitalization Summary: Please see all hospital notes, images, labs for full details of the hospitalization. ADMISSION HPI: Levon Penning is a 60 y.o. female with medical history significant for T2DM on insulin pump, hypertension, GERD to the emergency department accompanied by her husband due to having a syncopal episode at home, she complained of nausea, vomiting, generalized weakness and several loose black stools.  Patient states that her blood glucose level was running high today to greater than 500s, she also complained of frequent urination, generalized weakness, fatigue, lightheadedness and dizziness.  She endorsed having several episodes of black and bloody stools today as well as an episode of nonbloody vomiting.  Patient states that she lost consciousness while sitting on the commode and husband who witnessed the episode states that patient lost consciousness and had some intense shaking thought to be seizure and lasted about 10-15 seconds, patient quickly recovered without a postictal state.  She denies fever, hitting her head, chest pain, headache or abdominal pain.  Patient states that she has been having on and off intermittent bloody stools over the past 6 months, she states that she had colonoscopy recently whereby polyps were removed.  ED Course:  In the emergency department, she was  hemodynamically stable, though orthostatic vital signs was positive for orthostatic hypotension.  Work-up in the ED showed mild leukocytosis, macrocytic anemia, hyponatremia, hyperglycemia, troponin x 2- 31 >33, hypoalbuminemia.  FOBT was positive.  Urinalysis was positive for glycosuria and ketonuria.  SARS coronavirus 2 was positive.  CT of head without contrast showed no acute intracranial abnormality.  Patient was started on IV Protonix drip, IV hydration was provided, Zofran was given.  Hospitalist was asked to admit patient for further evaluation and management.  Hospital Course Symptomatic acute blood loss anemia-stable -Status post 2 unit PRBC transfusionon admission -CBC stable.  -Status post EGD 3/13 with oozing duodenal ulcer status post ablation -Continue soft diet -PPI drip continued for total 72 hours -continue omeprazole 40 mg BID x 90 days (Rx sent to pharmacy on record due to patient leaving AMA) -Outpatient follow up with GI strongly recommended. - Pt strongly advised to avoid NSAIDs  Syncope and collapse secondary to above -Carotid ultrasound with no significant findings -2D echocardiogramwith LVEF 65 to 70% with no valvular findings  Hypomagnesemia/hypokalemia -PT REFUSED TO RECEIVE IV Mg ordered on 3/16 when she left AMA  Hyperglycemia in setting of type 1 diabetes-improved -resumed insulin pump and home management   Macrocytic anemia-stable -Continue to monitor closely -Y17 and folic acid levels within normal limits  Essential hypertension-stable -Blood pressure is beginning to elevate,continuehome losartan and amlodipine  Incidental COVID-19 infection -Asymptomatic, no need for remdesivir or steroids -Isolation precautions -Vitamin C and zinc  GERD -IV Protonix dripto continue for total 72 hours through midnight 3/15  DVT prophylaxis:SCDs Code Status:Full Family Communication:husband at bedside Disposition Plan: Status is:  Inpatient  Discharge Diagnoses:  Principal Problem:  GI bleed Active Problems:   Syncope and collapse   Nausea & vomiting   Diarrhea   Symptomatic anemia   Macrocytic anemia   COVID-19 virus infection   Hypoalbuminemia   Orthostatic hypotension   Dehydration   Hyperglycemia due to type 2 diabetes mellitus (HCC)   Duodenal ulcer   Discharge Instructions: Discharge Instructions    Ambulatory referral to Gastroenterology   Complete by: As directed    Hospital follow up 3-4 weeks per Dr. Levon Hedger     Allergies as of 01/25/2021      Reactions   Acetaminophen Anaphylaxis   Lisinopril Hives, Swelling      Medication List    STOP taking these medications   blood glucose meter kit and supplies   insulin detemir 100 UNIT/ML FlexPen Commonly known as: LEVEMIR   Insulin Pen Needle 31G X 5 MM Misc     TAKE these medications   amLODipine 2.5 MG tablet Commonly known as: NORVASC Take 2.5 mg by mouth daily.   losartan 100 MG tablet Commonly known as: Cozaar Take 1 tablet (100 mg total) by mouth daily.   metFORMIN 500 MG tablet Commonly known as: Glucophage Take 1 tablet (500 mg total) by mouth 2 (two) times daily with a meal.   multivitamin with minerals Tabs tablet Take 1 tablet by mouth daily.   NovoLOG 100 UNIT/ML injection Generic drug: insulin aspart Inject into the skin See admin instructions. Uses insuliin via pump.   omeprazole 40 MG capsule Commonly known as: PRILOSEC Take 1 capsule (40 mg total) by mouth in the morning and at bedtime. What changed:   medication strength  how much to take  when to take this       Follow-up Information    Via, Caryn Bee, MD. Schedule an appointment as soon as possible for a visit in 1 week(s).   Specialty: Family Medicine Contact information: [address] [place] [address] [phone]        Marguerita Merles, Reuel Boom, MD. Schedule an appointment as soon as possible for a visit in 3 week(s).    Specialty: Gastroenterology Why: Hospital Follow Up  Contact information: [address] [place] [address] [phone]              Allergies  Allergen Reactions  . Acetaminophen Anaphylaxis  . Lisinopril Hives and Swelling   Allergies as of 01/25/2021      Reactions   Acetaminophen Anaphylaxis   Lisinopril Hives, Swelling      Medication List    STOP taking these medications   blood glucose meter kit and supplies   insulin detemir 100 UNIT/ML FlexPen Commonly known as: LEVEMIR   Insulin Pen Needle 31G X 5 MM Misc     TAKE these medications   amLODipine 2.5 MG tablet Commonly known as: NORVASC Take 2.5 mg by mouth daily.   losartan 100 MG tablet Commonly known as: Cozaar Take 1 tablet (100 mg total) by mouth daily.   metFORMIN 500 MG tablet Commonly known as: Glucophage Take 1 tablet (500 mg total) by mouth 2 (two) times daily with a meal.   multivitamin with minerals Tabs tablet Take 1 tablet by mouth daily.   NovoLOG 100 UNIT/ML injection Generic drug: insulin aspart Inject into the skin See admin instructions. Uses insuliin via pump.   omeprazole 40 MG capsule Commonly known as: PRILOSEC Take 1 capsule (40 mg total) by mouth in the morning and at bedtime. What changed:   medication  strength  how much to take  when to take this       Procedures/Studies: CT Head Wo Contrast  Result Date: 01/21/2021 CLINICAL DATA:  Syncope. EXAM: CT HEAD WITHOUT CONTRAST TECHNIQUE: Contiguous axial images were obtained from the base of the skull through the vertex without intravenous contrast. COMPARISON:  December 01, 2017 FINDINGS: Brain: There is mild cerebral atrophy with widening of the extra-axial spaces and ventricular dilatation. There are areas of decreased attenuation within the white matter tracts of the supratentorial brain, consistent with microvascular disease changes. Vascular: No hyperdense vessel or unexpected calcification.  Skull: Normal. Negative for fracture or focal lesion. Sinuses/Orbits: No acute finding. Other: None. IMPRESSION: 1. Generalized cerebral atrophy. 2. No acute intracranial abnormality. Electronically Signed   By: Virgina Norfolk M.D.   On: 01/21/2021 23:53   US Carotid Bilateral  Result Date: 01/22/2021 CLINICAL DATA:  Syncope, hypertension, diabetes EXAM: BILATERAL CAROTID DUPLEX ULTRASOUND TECHNIQUE: Pearline Cables scale imaging, color Doppler and duplex ultrasound were performed of bilateral carotid and vertebral arteries in the neck. COMPARISON:  None. FINDINGS: Criteria: Quantification of carotid stenosis is based on velocity parameters that correlate the residual internal carotid diameter with NASCET-based stenosis levels, using the diameter of the distal internal carotid lumen as the denominator for stenosis measurement. The following velocity measurements were obtained: RIGHT ICA: 88/31 cm/sec CCA: 21/22 cm/sec SYSTOLIC ICA/CCA RATIO:  1.0 ECA: 117 cm/sec LEFT ICA: 98/33 cm/sec CCA: 48/25 cm/sec SYSTOLIC ICA/CCA RATIO:  1.3 ECA: 88 cm/sec RIGHT CAROTID ARTERY: Minor echogenic shadowing plaque formation. No hemodynamically significant right ICA stenosis, velocity elevation, or turbulent flow. Degree of narrowing less than 50%. RIGHT VERTEBRAL ARTERY:  Normal antegrade flow LEFT CAROTID ARTERY: Similar scattered minor echogenic plaque formation. No hemodynamically significant left ICA stenosis, velocity elevation, or turbulent flow. LEFT VERTEBRAL ARTERY:  Normal antegrade flow IMPRESSION: Minor carotid atherosclerosis. No hemodynamically significant ICA stenosis. Degree of narrowing less than 50% bilaterally by ultrasound criteria. Patent antegrade vertebral flow bilaterally Electronically Signed   By: Jerilynn Mages.  Shick M.D.   On: 01/22/2021 10:41   ECHOCARDIOGRAM LIMITED  Result Date: 01/22/2021    ECHOCARDIOGRAM LIMITED REPORT   Patient Name:   MALANA EBERWEIN Date of Exam: 01/22/2021 Medical Rec #:  [id number]              Height:       65.0 in Accession #:    [id number]            Weight:       141.1 lb Date of Birth:  04/25/1961            BSA:          1.706 m Patient Age:    6 years              BP:           106/57 mmHg Patient Gender: F                     HR:           69 bpm. Exam Location:  [facility] Procedure: Cardiac Doppler, Color Doppler and Limited Echo Indications:    Syncope  History:        Patient has no prior history of Echocardiogram examinations.                 Signs/Symptoms:Syncope and Hypotension; Risk Factors:Diabetes.  COVID+, GI bleed.  Sonographer:    Dustin Flock RDCS Referring Phys: [id number] OLADAPO ADEFESO IMPRESSIONS  1. Left ventricular ejection fraction, by estimation, is 65 to 70%. The left ventricle has normal function. The left ventricle has no regional wall motion abnormalities.  2. Right ventricular systolic function is normal. The right ventricular size is normal. There is normal pulmonary artery systolic pressure.  3. The mitral valve is normal in structure. Trivial mitral valve regurgitation. No evidence of mitral stenosis.  4. The aortic valve is normal in structure. Aortic valve regurgitation is not visualized. No aortic stenosis is present.  5. The inferior vena cava is normal in size with greater than 50% respiratory variability, suggesting right atrial pressure of 3 mmHg. FINDINGS  Left Ventricle: Left ventricular ejection fraction, by estimation, is 65 to 70%. The left ventricle has normal function. The left ventricle has no regional wall motion abnormalities. The left ventricular internal cavity size was normal in size. There is  no concentric left ventricular hypertrophy. Right Ventricle: The right ventricular size is normal. No increase in right ventricular wall thickness. Right ventricular systolic function is normal. There is normal pulmonary artery systolic pressure. The tricuspid regurgitant velocity is 1.71 m/s, and  with an assumed right atrial  pressure of 3 mmHg, the estimated right ventricular systolic pressure is 25.0 mmHg. Left Atrium: Left atrial size was normal in size. Right Atrium: Right atrial size was normal in size. Pericardium: There is no evidence of pericardial effusion. Mitral Valve: The mitral valve is normal in structure. Trivial mitral valve regurgitation. No evidence of mitral valve stenosis. Tricuspid Valve: The tricuspid valve is normal in structure. Tricuspid valve regurgitation is trivial. No evidence of tricuspid stenosis. Aortic Valve: The aortic valve is normal in structure. Aortic valve regurgitation is not visualized. No aortic stenosis is present. Aortic valve peak gradient measures 13.8 mmHg. Pulmonic Valve: The pulmonic valve was normal in structure. Pulmonic valve regurgitation is not visualized. No evidence of pulmonic stenosis. Aorta: The aortic root is normal in size and structure. Venous: The inferior vena cava is normal in size with greater than 50% respiratory variability, suggesting right atrial pressure of 3 mmHg. IAS/Shunts: No atrial level shunt detected by color flow Doppler. LEFT VENTRICLE PLAX 2D LVIDd:         4.65 cm  Diastology LVIDs:         2.79 cm  LV e' medial:    9.68 cm/s LV PW:         1.06 cm  LV E/e' medial:  9.7 LV IVS:        1.02 cm  LV e' lateral:   11.00 cm/s LVOT diam:     2.00 cm  LV E/e' lateral: 8.5 LVOT Area:     3.14 cm  RIGHT VENTRICLE RV S prime:     15.10 cm/s LEFT ATRIUM         Index LA diam:    2.60 cm 1.52 cm/m  AORTIC VALVE AV Vmax:      186.00 cm/s AV Peak Grad: 13.8 mmHg  AORTA Ao Root diam: 2.80 cm MITRAL VALVE               TRICUSPID VALVE MV Area (PHT): 5.38 cm    TR Peak grad:   11.7 mmHg MV Decel Time: 141 msec    TR Vmax:        171.00 cm/s MV E velocity: 93.80 cm/s MV A velocity: 62.10 cm/s  SHUNTS MV E/A ratio:  1.51        Systemic Diam: 2.00 cm Skeet Latch md Electronically signed by Skeet Latch md Signature Date/Time: 01/22/2021/1:49:13 PM    Final       Discharge Exam: Vitals:   01/24/21 2149 01/25/21 0525  BP: (!) 133/105 (!) 149/92  Pulse: 78 81  Resp: 18 18  Temp: (!) 100.4 F (38 C) 98.7 F (37.1 C)  SpO2: 100% 98%   Vitals:   01/24/21 0500 01/24/21 1019 01/24/21 2149 01/25/21 0525  BP: (!) 147/76 (!) 150/95 (!) 133/105 (!) 149/92  Pulse:  70 78 81  Resp: $Remo'19 16 18 18  'fNOuL$ Temp: 98.3 F (36.8 C)  (!) 100.4 F (38 C) 98.7 F (37.1 C)  TempSrc: Oral  Oral Oral  SpO2: 99% 99% 100% 98%  Weight:      Height:        The results of significant diagnostics from this hospitalization (including imaging, microbiology, ancillary and laboratory) are listed below for reference.     Microbiology: Recent Results (from the past 240 hour(s))  Resp Panel by RT-PCR (Flu A&B, Covid) Nasopharyngeal Swab     Status: Abnormal   Collection Time: 01/22/21 12:40 AM   Specimen: Nasopharyngeal Swab; Nasopharyngeal(NP) swabs in vial transport medium  Result Value Ref Range Status   SARS Coronavirus 2 by RT PCR POSITIVE (A) NEGATIVE Final    Comment: RESULT CALLED TO, READ BACK BY AND VERIFIED WITH: SAPPELT,J @ 0152 ON 01/22/21 BY JUW (NOTE) SARS-CoV-2 target nucleic acids are DETECTED.  The SARS-CoV-2 RNA is generally detectable in upper respiratory specimens during the acute phase of infection. Positive results are indicative of the presence of the identified virus, but do not rule out bacterial infection or co-infection with other pathogens not detected by the test. Clinical correlation with patient history and other diagnostic information is necessary to determine patient infection status. The expected result is Negative.  Fact Sheet for Patients: EntrepreneurPulse.com.au  Fact Sheet for Healthcare Providers: IncredibleEmployment.be  This test is not yet approved or cleared by the [place] FDA and  has been authorized for detection and/or diagnosis of SARS-CoV-2 by FDA under an Emergency Use  Authorization (EUA).  This EUA will remain in effect (meaning this test can b e used) for the duration of  the COVID-19 declaration under Section 564(b)(1) of the Act, 21 U.S.C. section 360bbb-3(b)(1), unless the authorization is terminated or revoked sooner.     Influenza A by PCR NEGATIVE NEGATIVE Final   Influenza B by PCR NEGATIVE NEGATIVE Final    Comment: (NOTE) The Xpert Xpress SARS-CoV-2/FLU/RSV plus assay is intended as an aid in the diagnosis of influenza from Nasopharyngeal swab specimens and should not be used as a sole basis for treatment. Nasal washings and aspirates are unacceptable for Xpert Xpress SARS-CoV-2/FLU/RSV testing.  Fact Sheet for Patients: EntrepreneurPulse.com.au  Fact Sheet for Healthcare Providers: IncredibleEmployment.be  This test is not yet approved or cleared by the [place] FDA and has been authorized for detection and/or diagnosis of SARS-CoV-2 by FDA under an Emergency Use Authorization (EUA). This EUA will remain in effect (meaning this test can be used) for the duration of the COVID-19 declaration under Section 564(b)(1) of the Act, 21 U.S.C. section 360bbb-3(b)(1), unless the authorization is terminated or revoked.  Performed at [facility], [address], [place], [place] [address]   MRSA PCR Screening     Status: None   Collection Time: 01/22/21  4:14 AM   Specimen: Nasal Mucosa; Nasopharyngeal  Result Value Ref Range Status   MRSA by PCR NEGATIVE NEGATIVE Final    Comment:        The GeneXpert MRSA Assay (FDA approved for NASAL specimens only), is one component of a comprehensive MRSA colonization surveillance program. It is not intended to diagnose MRSA infection nor to guide or monitor treatment for MRSA infections. Performed at [facility], [address], [place], [place] [address]      Labs: BNP (last 3 results) No results for input(s): BNP in the last 8760 hours. Basic  Metabolic Panel: Recent Labs  Lab 01/21/21 2155 01/22/21 1219 01/23/21 0436 01/24/21 0635 01/25/21 0621  NA 133* 136 140 141 137  K 5.1 3.7 3.6 3.2* 3.8  CL 103 108 112* 106 105  CO2 20* 20* 21* 25 23  GLUCOSE 422* 274* 159* 155* 233*  BUN 26* 16 7 <5* 6  CREATININE 0.80 0.64 0.55 0.57 0.59  CALCIUM 7.5* 7.5* 8.0* 8.1* 8.5*  MG  --  1.4* 1.3* 1.4* 1.5*  PHOS  --  2.4*  --   --   --    Liver Function Tests: Recent Labs  Lab 01/21/21 2155 01/22/21 1219 01/23/21 0436  AST 13* 15 15  ALT $Re'13 12 13  'NvH$ ALKPHOS 65 56 49  BILITOT 0.7 0.9 0.5  PROT 4.9* 4.6* 4.5*  ALBUMIN 2.6* 2.5* 2.4*   No results for input(s): LIPASE, AMYLASE in the last 168 hours. No results for input(s): AMMONIA in the last 168 hours. CBC: Recent Labs  Lab 01/21/21 2155 01/22/21 1219 01/23/21 0436 01/24/21 0635 01/25/21 0621  WBC 11.2* 8.5 5.0 4.4 5.2  NEUTROABS 10.3*  --   --   --   --   HGB 7.4* 8.7* 8.2* 9.2* 9.2*  HCT 23.2* 26.1* 24.7* 27.0* 27.7*  MCV 105.0* 97.8 97.2 97.5 96.9  PLT 146* 118* 110* 127* 150   Cardiac Enzymes: No results for input(s): CKTOTAL, CKMB, CKMBINDEX, TROPONINI in the last 168 hours. BNP: Invalid input(s): POCBNP CBG: Recent Labs  Lab 01/24/21 1239 01/24/21 1726 01/24/21 2146 01/25/21 0156 01/25/21 0749  GLUCAP 186* 216* 197* 228* 271*   D-Dimer No results for input(s): DDIMER in the last 72 hours. Hgb A1c No results for input(s): HGBA1C in the last 72 hours. Lipid Profile No results for input(s): CHOL, HDL, LDLCALC, TRIG, CHOLHDL, LDLDIRECT in the last 72 hours. Thyroid function studies No results for input(s): TSH, T4TOTAL, T3FREE, THYROIDAB in the last 72 hours.  Invalid input(s): FREET3 Anemia work up Recent Labs    01/22/21 1219  VITAMINB12 483  FOLATE 16.1   Urinalysis    Component Value Date/Time   COLORURINE STRAW (A) 01/21/2021 0042   APPEARANCEUR CLEAR 01/21/2021 0042   LABSPEC 1.018 01/21/2021 0042   PHURINE 5.0 01/21/2021 0042    GLUCOSEU >=500 (A) 01/21/2021 0042   HGBUR NEGATIVE 01/21/2021 0042   BILIRUBINUR NEGATIVE 01/21/2021 0042   KETONESUR 20 (A) 01/21/2021 0042   PROTEINUR NEGATIVE 01/21/2021 0042   NITRITE NEGATIVE 01/21/2021 0042   LEUKOCYTESUR NEGATIVE 01/21/2021 0042   Sepsis Labs Invalid input(s): PROCALCITONIN,  WBC,  LACTICIDVEN Microbiology Recent Results (from the past 240 hour(s))  Resp Panel by RT-PCR (Flu A&B, Covid) Nasopharyngeal Swab     Status: Abnormal   Collection Time: 01/22/21 12:40 AM   Specimen: Nasopharyngeal Swab; Nasopharyngeal(NP) swabs in vial transport medium  Result Value Ref Range Status   SARS Coronavirus 2 by RT PCR POSITIVE (A) NEGATIVE Final    Comment: RESULT CALLED  TO, READ BACK BY AND VERIFIED WITH: SAPPELT,J @ 0152 ON 01/22/21 BY JUW (NOTE) SARS-CoV-2 target nucleic acids are DETECTED.  The SARS-CoV-2 RNA is generally detectable in upper respiratory specimens during the acute phase of infection. Positive results are indicative of the presence of the identified virus, but do not rule out bacterial infection or co-infection with other pathogens not detected by the test. Clinical correlation with patient history and other diagnostic information is necessary to determine patient infection status. The expected result is Negative.  Fact Sheet for Patients: EntrepreneurPulse.com.au  Fact Sheet for Healthcare Providers: IncredibleEmployment.be  This test is not yet approved or cleared by the [place] FDA and  has been authorized for detection and/or diagnosis of SARS-CoV-2 by FDA under an Emergency Use Authorization (EUA).  This EUA will remain in effect (meaning this test can b e used) for the duration of  the COVID-19 declaration under Section 564(b)(1) of the Act, 21 U.S.C. section 360bbb-3(b)(1), unless the authorization is terminated or revoked sooner.     Influenza A by PCR NEGATIVE NEGATIVE Final   Influenza B by  PCR NEGATIVE NEGATIVE Final    Comment: (NOTE) The Xpert Xpress SARS-CoV-2/FLU/RSV plus assay is intended as an aid in the diagnosis of influenza from Nasopharyngeal swab specimens and should not be used as a sole basis for treatment. Nasal washings and aspirates are unacceptable for Xpert Xpress SARS-CoV-2/FLU/RSV testing.  Fact Sheet for Patients: EntrepreneurPulse.com.au  Fact Sheet for Healthcare Providers: IncredibleEmployment.be  This test is not yet approved or cleared by the [place] FDA and has been authorized for detection and/or diagnosis of SARS-CoV-2 by FDA under an Emergency Use Authorization (EUA). This EUA will remain in effect (meaning this test can be used) for the duration of the COVID-19 declaration under Section 564(b)(1) of the Act, 21 U.S.C. section 360bbb-3(b)(1), unless the authorization is terminated or revoked.  Performed at [facility], [address] [place], [place] [address]   MRSA PCR Screening     Status: None   Collection Time: 01/22/21  4:14 AM   Specimen: Nasal Mucosa; Nasopharyngeal  Result Value Ref Range Status   MRSA by PCR NEGATIVE NEGATIVE Final    Comment:        The GeneXpert MRSA Assay (FDA approved for NASAL specimens only), is one component of a comprehensive MRSA colonization surveillance program. It is not intended to diagnose MRSA infection nor to guide or monitor treatment for MRSA infections. Performed at [facility], [address] [place], [place] [address]     Time coordinating discharge:   SIGNED:  Irwin Brakeman, MD  [facility] 01/25/2021, 10:01 AM How to contact the [facility] Acute Care Attending or Consulting provider [place] or covering provider during after hours [place], for this patient?  1. Check the care team in [facility] and look for a) attending/consulting [facility] provider listed and b) the [facility] team listed 2. Log into [email or web address] and use [facility]'s universal password to  access. If you do not have the password, please contact the hospital operator. 3. Locate the [facility] provider you are looking for under [facility] and page to a number that you can be directly reached. 4. If you still have difficulty reaching the provider, please page the [facility] (Director on Call) for the Hospitalists listed on amion for assistance.

## 2021-01-25 NOTE — Telephone Encounter (Signed)
Mitzie please schedule with Dr. Loletha Grayer  Thanks

## 2021-01-25 NOTE — Telephone Encounter (Signed)
Thanks, Reba!   Needs to return in 3-4 weeks. Thanks!

## 2021-04-20 ENCOUNTER — Ambulatory Visit (INDEPENDENT_AMBULATORY_CARE_PROVIDER_SITE_OTHER): Payer: 59 | Admitting: Gastroenterology

## 2021-04-26 ENCOUNTER — Encounter (HOSPITAL_COMMUNITY): Payer: Self-pay

## 2021-04-26 ENCOUNTER — Emergency Department (HOSPITAL_COMMUNITY): Payer: 59

## 2021-04-26 ENCOUNTER — Other Ambulatory Visit: Payer: Self-pay

## 2021-04-26 ENCOUNTER — Observation Stay (HOSPITAL_COMMUNITY)
Admission: EM | Admit: 2021-04-26 | Discharge: 2021-04-28 | Disposition: A | Discharge status: Home / Self Care | Payer: 59 | Attending: Internal Medicine | Admitting: Internal Medicine

## 2021-04-26 ENCOUNTER — Emergency Department (HOSPITAL_COMMUNITY)
Admission: EM | Admit: 2021-04-26 | Discharge: 2021-04-26 | Disposition: A | Discharge status: Home / Self Care | Payer: 59 | Source: Home / Self Care | Attending: Emergency Medicine | Admitting: Emergency Medicine

## 2021-04-26 DIAGNOSIS — R251 Tremor, unspecified: Secondary | ICD-10-CM

## 2021-04-26 DIAGNOSIS — Z794 Long term (current) use of insulin: Secondary | ICD-10-CM | POA: Diagnosis not present

## 2021-04-26 DIAGNOSIS — E1165 Type 2 diabetes mellitus with hyperglycemia: Secondary | ICD-10-CM

## 2021-04-26 DIAGNOSIS — R55 Syncope and collapse: Secondary | ICD-10-CM | POA: Diagnosis not present

## 2021-04-26 DIAGNOSIS — R569 Unspecified convulsions: Secondary | ICD-10-CM | POA: Diagnosis present

## 2021-04-26 DIAGNOSIS — Z7984 Long term (current) use of oral hypoglycemic drugs: Secondary | ICD-10-CM | POA: Insufficient documentation

## 2021-04-26 DIAGNOSIS — E119 Type 2 diabetes mellitus without complications: Secondary | ICD-10-CM | POA: Insufficient documentation

## 2021-04-26 DIAGNOSIS — I10 Essential (primary) hypertension: Secondary | ICD-10-CM | POA: Insufficient documentation

## 2021-04-26 DIAGNOSIS — Z87891 Personal history of nicotine dependence: Secondary | ICD-10-CM | POA: Insufficient documentation

## 2021-04-26 DIAGNOSIS — Z8616 Personal history of COVID-19: Secondary | ICD-10-CM | POA: Insufficient documentation

## 2021-04-26 DIAGNOSIS — K219 Gastro-esophageal reflux disease without esophagitis: Secondary | ICD-10-CM | POA: Insufficient documentation

## 2021-04-26 DIAGNOSIS — Z20822 Contact with and (suspected) exposure to covid-19: Secondary | ICD-10-CM | POA: Insufficient documentation

## 2021-04-26 DIAGNOSIS — Z79899 Other long term (current) drug therapy: Secondary | ICD-10-CM | POA: Insufficient documentation

## 2021-04-26 LAB — RESP PANEL BY RT-PCR (FLU A&B, COVID) ARPGX2
Influenza A by PCR: NEGATIVE
Influenza B by PCR: NEGATIVE
SARS Coronavirus 2 by RT PCR: NEGATIVE

## 2021-04-26 LAB — CBC WITH DIFFERENTIAL/PLATELET
Abs Immature Granulocytes: 0.02 10*3/uL (ref 0.00–0.07)
Basophils Absolute: 0 10*3/uL (ref 0.0–0.1)
Basophils Relative: 0 %
Eosinophils Absolute: 0.2 10*3/uL (ref 0.0–0.5)
Eosinophils Relative: 3 %
HCT: 35.4 % — ABNORMAL LOW (ref 36.0–46.0)
Hemoglobin: 11.6 g/dL — ABNORMAL LOW (ref 12.0–15.0)
Immature Granulocytes: 0 %
Lymphocytes Relative: 19 %
Lymphs Abs: 1.1 10*3/uL (ref 0.7–4.0)
MCH: 29.5 pg (ref 26.0–34.0)
MCHC: 32.8 g/dL (ref 30.0–36.0)
MCV: 90.1 fL (ref 80.0–100.0)
Monocytes Absolute: 0.4 10*3/uL (ref 0.1–1.0)
Monocytes Relative: 6 %
Neutro Abs: 4.2 10*3/uL (ref 1.7–7.7)
Neutrophils Relative %: 72 %
Platelets: 177 10*3/uL (ref 150–400)
RBC: 3.93 MIL/uL (ref 3.87–5.11)
RDW: 17.2 % — ABNORMAL HIGH (ref 11.5–15.5)
WBC: 5.8 10*3/uL (ref 4.0–10.5)
nRBC: 0 % (ref 0.0–0.2)

## 2021-04-26 LAB — BASIC METABOLIC PANEL
Anion gap: 11 (ref 5–15)
BUN: 10 mg/dL (ref 6–20)
CO2: 19 mmol/L — ABNORMAL LOW (ref 22–32)
Calcium: 8.7 mg/dL — ABNORMAL LOW (ref 8.9–10.3)
Chloride: 105 mmol/L (ref 98–111)
Creatinine, Ser: 0.62 mg/dL (ref 0.44–1.00)
GFR, Estimated: >60 mL/min (ref >60)
Glucose, Bld: 286 mg/dL — ABNORMAL HIGH (ref 70–99)
Potassium: 4 mmol/L (ref 3.5–5.1)
Sodium: 135 mmol/L (ref 135–145)

## 2021-04-26 LAB — RAPID URINE DRUG SCREEN, HOSP PERFORMED
Amphetamines: NOT DETECTED
Barbiturates: NOT DETECTED
Benzodiazepines: NOT DETECTED
Cocaine: NOT DETECTED
Opiates: NOT DETECTED
Tetrahydrocannabinol: NOT DETECTED

## 2021-04-26 LAB — CBG MONITORING, ED
Glucose-Capillary: 191 mg/dL — ABNORMAL HIGH (ref 70–99)
Glucose-Capillary: 293 mg/dL — ABNORMAL HIGH (ref 70–99)
Glucose-Capillary: 140 mg/dL — ABNORMAL HIGH (ref 70–99)

## 2021-04-26 LAB — URINALYSIS, ROUTINE W REFLEX MICROSCOPIC
Bacteria, UA: NONE SEEN
Bilirubin Urine: NEGATIVE
Glucose, UA: >=500 mg/dL — AB
Hgb urine dipstick: NEGATIVE
Ketones, ur: 80 mg/dL — AB
Leukocytes,Ua: NEGATIVE
Nitrite: NEGATIVE
Protein, ur: NEGATIVE mg/dL
Specific Gravity, Urine: 1.017 (ref 1.005–1.030)
pH: 5 (ref 5.0–8.0)

## 2021-04-26 LAB — TROPONIN I (HIGH SENSITIVITY): Troponin I (High Sensitivity): 3 ng/L (ref <18)

## 2021-04-26 MED ORDER — SODIUM CHLORIDE 0.9 % IV BOLUS
1000.0000 mL | Freq: Once | INTRAVENOUS | Status: AC
  Administered 2021-04-26: 1000 mL via INTRAVENOUS

## 2021-04-26 MED ORDER — ENOXAPARIN SODIUM 40 MG/0.4ML IJ SOSY
40.0000 mg | PREFILLED_SYRINGE | INTRAMUSCULAR | Status: DC
  Administered 2021-04-26 – 2021-04-27 (×2): 40 mg via SUBCUTANEOUS
  Filled 2021-04-26 (×2): qty 0.4

## 2021-04-26 MED ORDER — LORAZEPAM 2 MG/ML IJ SOLN
1.0000 mg | Freq: Once | INTRAMUSCULAR | Status: AC
  Administered 2021-04-26: 1 mg via INTRAVENOUS
  Filled 2021-04-26: qty 1

## 2021-04-26 NOTE — ED Notes (Signed)
Teleneuro cart placed at bedside. Husband at bedside. Pt reports HA 5/10 at this time, alert and oriented x 4. Pt requesting PO fluids, made pt aware that if she is experiencing seizure-like activity we are cautioned for aspiration and swabs will be provided for the dry mouth. Bed locked and low, side rails up x 2, call bell within reach. Pt remains on cardiac monitoring.

## 2021-04-26 NOTE — ED Triage Notes (Signed)
Pt brought to ED via [facility] for full body seizure witnessed by husband. Post-ictal upon EMS arrival. 20g L FA. CBG 252, BP 142/87.

## 2021-04-26 NOTE — Discharge Instructions (Addendum)
Lab work and imaging are all reassuring.  Recommend continuing with all home medication as prescribed.  You are slightly dehydrated my exam please remove and stay hydrated.  Please follow-up with neurology for further evaluation.  Come back to the emergency department if you develop chest pain, shortness of breath, severe abdominal pain, uncontrolled nausea, vomiting, diarrhea.

## 2021-04-26 NOTE — H&P (Signed)
History and Physical  Laurie Larson WUJ:[id number] DOB: 10-07-1961 DOA: 04/26/2021  Referring physician: Margette Fast, MD PCP: Aretta Nip, MD  Patient coming from: Home  Chief Complaint: Seizure-like activity  HPI: Laurie Larson is a 60 y.o. female with medical history significant for  T2DM on insulin pump, hypertension, GERD who presents to the emergency department due to seizure-like activity at home today.  Patient complained of an episode of shaking around 11 AM this morning, so she presented to the ED after being evaluated, her presentation was depicted to be atypical, since patient was alert but felt like she was unable to control the shaking, she was discharged home.  She had another episode at home around 5:30 PM per husband at bedside, during which patient had another seizure-like activity, but this time, she was unconscious of the activity and she had about 30 minutes of postictal confusion prior to to her baseline.  No biting of tongue, bowel or urinary incontinence reported.  EMS was activated and she was taken to the ED for reevaluation of symptoms.  Patient denies habitual alcohol consumption or benzodiazepine use. She was recently admitted from 3/12-3/16 due to symptomatic acute blood loss anemia s/p 2 units of PRBC transfusion and syncope and collapse due to the anemia  ED Course:  In the emergency department, she was hemodynamically stable, repeat was 150/91.  Work-up in the ED showed normocytic anemia and normal BMP except for hyperglycemia.  Troponin x1 was negative, urine drug screen was negative, urinalysis was unimpressive for UTI.  Influenza A, B, SARS coronovirus 2 was negative. CT of head without contrast showed no acute intracranial abnormality In the ED, she had order minimal shakes while awake and alert, IV Ativan 1 mg x 1 was given, IV hydration was provided.  Teleneurology was consulted mended admitting patient for MRI and routine EEG in the  morning.  Hospitalist was admitted for further evaluation and management.  Review of Systems: Constitutional: Negative for chills and fever.  HENT: Negative for ear pain and sore throat.   Eyes: Negative for pain and visual disturbance.  Respiratory: Negative for cough, chest tightness and shortness of breath.   Cardiovascular: Negative for chest pain and palpitations.  Gastrointestinal: Negative for abdominal pain and vomiting.  Endocrine: Negative for polyphagia and polyuria.  Genitourinary: Negative for decreased urine volume, dysuria, enuresis Musculoskeletal: Negative for arthralgias and back pain.  Skin: Negative for color change and rash.  Allergic/Immunologic: Negative for immunocompromised state.  Neurological: Positive for seizure-like activity.  Negative for tremors, syncope, speech difficulty, weakness, light-headedness and headaches.  Hematological: Does not bruise/bleed easily.  All other systems reviewed and are negative  Past Medical History:  Diagnosis Date   Benign tumor of breast    removed   Depressive episode    rersolved   Diabetes mellitus without complication ([place])    Diarrhea    GI Outlaw   GERD (gastroesophageal reflux disease) 04/27/2021   History of stomach ulcers    Hypertension    Rectal bleeding    Symptomatic anemia 01/22/2021   Past Surgical History:  Procedure Laterality Date   ABDOMINAL HYSTERECTOMY     Total   AUGMENTATION MAMMAPLASTY     BREAST ENHANCEMENT SURGERY     BREAST EXCISIONAL BIOPSY     BREAST SURGERY     benign right breast   COLONOSCOPY  12/2012   polyps benign, diverticulosis, hemorrhoids, recheck in 3 years   ESOPHAGOGASTRODUODENOSCOPY     normal esophagus,gastritis, duodenal erosions without  bleeding, bx benign   ESOPHAGOGASTRODUODENOSCOPY (EGD) WITH PROPOFOL N/A 01/22/2021   Procedure: ESOPHAGOGASTRODUODENOSCOPY (EGD) WITH PROPOFOL;  Surgeon: Harvel Quale, MD;  Location: [facility];  Service:  Gastroenterology;  Laterality: N/A;    Social History:  reports that she quit smoking about 8 months ago. Her smoking use included cigarettes. She has a 7.50 pack-year smoking history. She has never used smokeless tobacco. She reports current alcohol use of about 5.0 standard drinks of alcohol per week. She reports that she does not use drugs.   Allergies  Allergen Reactions   Acetaminophen Anaphylaxis   Lisinopril Hives and Swelling    Family History  Problem Relation Age of Onset   Ovarian cancer Maternal Aunt        deceased   Juvenile Diabetes Maternal Aunt    Breast cancer Maternal Aunt    Cancer Maternal Grandmother        type unknown   Breast cancer Sister    Breast cancer Mother        deceased     Prior to Admission medications   Medication Sig Start Date End Date Taking? Authorizing Provider  amLODipine (NORVASC) 2.5 MG tablet Take 2.5 mg by mouth daily. 10/18/20  Yes [provider]  losartan (COZAAR) 100 MG tablet Take 1 tablet (100 mg total) by mouth daily. 12/02/17 04/26/21 Yes Dhungel, Nishant, MD  Multiple Vitamin (MULTIVITAMIN WITH MINERALS) TABS tablet Take 1 tablet by mouth daily.   Yes [provider]  NOVOLOG 100 UNIT/ML injection Inject into the skin See admin instructions. Uses insuliin via pump. 11/21/20  Yes [provider]  metFORMIN (GLUCOPHAGE) 500 MG tablet Take 1 tablet (500 mg total) by mouth 2 (two) times daily with a meal. Patient not taking: Reported on 04/26/2021 12/02/17 12/02/18  Dhungel, Flonnie Overman, MD  omeprazole (PRILOSEC) 40 MG capsule Take 1 capsule (40 mg total) by mouth in the morning and at bedtime. 01/25/21 04/25/21  Murlean Iba, MD    Physical Exam: BP (!) 142/87   Pulse 80   Temp 97.7 F (36.5 C) (Oral)   Resp 15   Ht 5\' 5"  (1.651 m)   Wt 62.6 kg   SpO2 97%   BMI 22.96 kg/m   General: 60 y.o. year-old female well developed well nourished in no acute distress.  Alert and oriented x3. HEENT: NCAT,  EOMI Neck: Supple, trachea medial Cardiovascular: Regular rate and rhythm with no rubs or gallops.  No thyromegaly or JVD noted.  No lower extremity edema. 2/4 pulses in all 4 extremities. Respiratory: Clear to auscultation with no wheezes or rales. Good inspiratory effort. Abdomen: Soft, nontender nondistended with normal bowel sounds x4 quadrants. Muskuloskeletal: No cyanosis, clubbing or edema noted bilaterally Neuro: CN II-XII intact, strength 5/5 x 4, sensation, reflexes intact Skin: No ulcerative lesions noted or rashes Psychiatry: Judgement and insight appear normal. Mood is appropriate for condition and setting          Labs on Admission:  Basic Metabolic Panel: Recent Labs  Lab 04/26/21 1304  NA 135  K 4.0  CL 105  CO2 19*  GLUCOSE 286*  BUN 10  CREATININE 0.62  CALCIUM 8.7*   Liver Function Tests: No results for input(s): AST, ALT, ALKPHOS, BILITOT, PROT, ALBUMIN in the last 168 hours. No results for input(s): LIPASE, AMYLASE in the last 168 hours. No results for input(s): AMMONIA in the last 168 hours. CBC: Recent Labs  Lab 04/26/21 1304  WBC 5.8  NEUTROABS 4.2  HGB 11.6*  HCT 35.4*  MCV 90.1  PLT 177   Cardiac Enzymes: No results for input(s): CKTOTAL, CKMB, CKMBINDEX, TROPONINI in the last 168 hours.  BNP (last 3 results) No results for input(s): BNP in the last 8760 hours.  ProBNP (last 3 results) No results for input(s): PROBNP in the last 8760 hours.  CBG: Recent Labs  Lab 04/26/21 1235 04/26/21 2022 04/26/21 2320  GLUCAP 191* 293* 140*    Radiological Exams on Admission: CT Head Wo Contrast  Result Date: 04/26/2021 CLINICAL DATA:  Recent seizure activity EXAM: CT HEAD WITHOUT CONTRAST TECHNIQUE: Contiguous axial images were obtained from the base of the skull through the vertex without intravenous contrast. COMPARISON:  01/21/2021 FINDINGS: Brain: No evidence of acute infarction, hemorrhage, hydrocephalus, extra-axial collection or mass  lesion/mass effect. Mild atrophic changes are again noted. Vascular: No hyperdense vessel or unexpected calcification. Skull: Normal. Negative for fracture or focal lesion. Sinuses/Orbits: No acute finding. Other: None. IMPRESSION: No acute intracranial abnormality. Electronically Signed   By: Inez Catalina M.D.   On: 04/26/2021 13:48    EKG: I independently viewed the EKG done and my findings are as followed: Normal sinus rhythm at a rate of 85 bpm  Assessment/Plan Present on Admission:  Essential hypertension  Principal Problem:   Witnessed seizure-like activity ([place]) Active Problems:   Essential hypertension   Hyperglycemia due to diabetes mellitus ([facility])   GERD (gastroesophageal reflux disease)   Witnessed seizure-like activity CT of head without contrast showed no acute intracranial abnormality MRI of brain without contrast will be done in the morning EEG will be done Continue fall precaution and neurochecks Consider neurology consult based on prior and EEG findings  Hyperglycemia secondary to T2DM Continue insulin pump Metformin will be held at this time Consider ISS and hypoglycemic protocol if blood glucose not well controlled with insulin pump  GERD Continue Protonix  Essential hypertension Continue Norvasc and Cozaar   DVT prophylaxis: Lovenox  Code Status: Full code  Family Communication: Husband at bedside (all questions answered to satisfaction)  Disposition Plan:  Patient is from:                        home Anticipated DC to:                   SNF or family members home Anticipated DC date:               2-3 days Anticipated DC barriers:          Patient requires inpatient management due to seizure-like activity which require further evaluation  Consults called: None  Admission status: Observation    Bernadette Hoit MD [facility]  04/27/2021, 2:13 AM

## 2021-04-26 NOTE — ED Notes (Signed)
Neuro present on cart at beside.

## 2021-04-26 NOTE — ED Notes (Signed)
On arrival into room patient started to have jerking movements throughout body, able to make eye contact but not able to communicate well. Faulker, PA notified and came to assess patient. Patient stopped movements after about 2 minutes and continued normal conversation with PA.

## 2021-04-26 NOTE — Consult Note (Signed)
[facility] Tele[facility]urology Consult Services  Stat Consult  Date of Service:   04/26/2021 19:06:17  Diagnosis:       R56.9 - Seizures  Impression: 60yoF hx of DM1 and HTN presented with sudden onset seizure-like activity. She had two different events. 1) Diffuse non-rhythmic jerking of all extremities with retain consciousness and memory of event, and following command, seen by multiple medical staff including myself. 2) Diffuse stiffening with LOC, does not follow command or make eye contact, no memory of event, and postictal confusion. Seen and described by husband. 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 Unable to determine if second event is epileptic or not. Denies trigger. Will hold off on starting AED for now until further data. Recommend MR brain w/wo and routine EEG. If abnormal, recommend starting on AED. If normal, need a discussion between neurologist and patient. Recommend Cognitive behavior therapy for the first event type. This website may also be helpful for resource and patient understanding (https://www.neurosymptoms.org/en/). Recommend follow up with neurologist outpatient, preferred epilepsy specialist, for possible EMU study.  Our recommendations are outlined below.  Diagnostic Studies: MRI brain w/wo contrast Routine EEG  Nursing Recommendations: Maintain Euglycemia and Euthermia  Seizure precautions: Seizure precautions including no driving for state mandated time frame were discussed with patient with clear understanding  DVT Prophylaxis: Choice of Primary Team  Disposition: Neurology will follow   Imaging: CTH: neg for acute  Metrics: [facility] Notification Time: 04/26/2021 19:04:27 Stamp Time: 04/26/2021 19:06:17 Callback Response Time: 04/26/2021 19:08:58   ----------------------------------------------------------------------------------------------------  Chief Complaint: seizure-like activity.  History of Present  Illness: Patient is a 60 year old Female.  Patient said she is having difficulty with shaking and cannot seem to stop. When she's shaking she cannot speak or breath. She denies pain other than back of head and neck. This morning her glucose was at 40 and she drank some juice. Around 11AM she started having seizure-like activity. Since then her symptom has gotten more severe and stronger each time. Whenever she raise her hands she started shaking, followed by her legs and her mouth and tongue. This occurred four times in a row. She was at ED earlier and they did CT scan and was told it was negative. She was then sent home. While at home when husband went to take a phone call, patient started having seizure-like activity again. She reports vague memory of feeling about passing out. 911 was called. One time in April she had a bleeding ulcer. She was in the bathroom while on the toilet. Husband report that episode lasted 2-3 minutes and did not return. She thinks they're trigger when she lift up her arm, excertion, excitment. She was awake and remember all the episode until the last episode at home. Patient was able to hear the husband throughout the episode. The last episode patient continued to have diffuse shaking, clenching her jaw, lips turning blue, blank stare, does not respond or stare at husband like usual. Lasting about 2-3 minutes. Then she appear confused. The patient only remember husband went to take a phone call and next time was EMT in the house. The seizure in April was more violent shaking, patient does not remember the episode. Patient denies any change in life. Last 6-9 months patient been having more issue with DM control. She had EEG as teenager due to passing out spells. She was worked up for epilepsy was determine it was not epilepsy. PMH: DM1, HTN. Had GI ulcer. PSH: hysterecmy, benign tumor removal from breast. FH: DM1,  HTN, breast cancer, ovarian cancer. Denies seizure history. 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 Former smoker, quit Sept 2021. Drinks couple beers and wine a day.          Examination: BP(170/96), Pulse(103), Blood Glucose(191)  Neuro Exam: General: Alert,Awake, Oriented to Time, Place, Person  Speech: Fluent:  Language: Intact:  Face: Symmetric: Right, Left  Visual Fields: Intact: Right, Left  Extraocular Movements: Intact: Right, Left  Motor Exam: No Drift: RUE, RLE, LUE  Sensation: Intact:  Coordination: Intact: RUE, RLE  With bilateral arms raise, she had irregular shaking of of both arms, worse on left. More flapping of left wrist. During exam patient had episode where she was having non-rhythmic jerking of both arms and legs, mouth opening/closing and making grunting sound. She was able to follow command to look in all direction reach for husband's hand.     Patient / Family was informed the Neurology Consult would occur via TeleHealth consult by way of interactive audio and video telecommunications and consented to receiving care in this manner.  Patient is being evaluated for possible acute neurologic impairment and high probability of imminent or life - threatening deterioration.I spent total of 62 minutes providing care to this patient, including time for face to face visit via telemedicine, review of medical records, imaging studies and discussion of findings with providers, the patient and / or family.   Dr Edward Jolly   [facility] [phone]  Case [id number]

## 2021-04-26 NOTE — ED Provider Notes (Signed)
[facility] Provider Note   CSN: [id number] Arrival date & time: 04/26/21  1230     History Chief Complaint  Patient presents with   Shaking    Laurie Larson is a 60 y.o. female.  HPI  Patient with significant medical history of benign tumor of the breast, depression, diabetes, hypertension presents to the emergency department with chief complaint of shaking.  Patient states last night her upper extremities started shaking and she had no control of it.  These episodes are intermittent, last approximately a minute and then resolved on its own, she then noted that her leg started to shake as well and she would have difficulty trying to speak, she denies biting her tongue, become incontinent, or being altered after the incidents.  She states this has never happened  past, she denies change in vision, paresthesias or weakness in the upper  or lower extremities, she denies recent head trauma, is not on anticoagulant, she denies history of IV drug use, denies history of seizure disorders, patient denies any alleviating factors.  Patient denies headaches, fevers, chills, shortness of breath or chest pain. Past Medical History:  Diagnosis Date   Benign tumor of breast    removed   Depressive episode    rersolved   Diabetes mellitus without complication ([employer])    Diarrhea    GI Outlaw   History of stomach ulcers    Hypertension    Rectal bleeding    Symptomatic anemia 01/22/2021    Patient Active Problem List   Diagnosis Date Noted   Duodenal ulcer    GI bleed 01/22/2021   Syncope and collapse 01/22/2021   Nausea & vomiting 01/22/2021   Diarrhea 01/22/2021   Symptomatic anemia 01/22/2021   Macrocytic anemia 01/22/2021   COVID-19 virus infection 01/22/2021   Hypoalbuminemia 01/22/2021   Orthostatic hypotension 01/22/2021   Dehydration 01/22/2021   Essential hypertension 12/02/2017   Hypokalemia 12/02/2017   Tobacco abuse counseling 12/02/2017   DKA,  type 2 ([place]) 12/02/2017   DKA (diabetic ketoacidoses) 12/01/2017   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    Past Surgical History:  Procedure Laterality Date   ABDOMINAL HYSTERECTOMY     Total   AUGMENTATION MAMMAPLASTY     BREAST ENHANCEMENT SURGERY     BREAST EXCISIONAL BIOPSY     BREAST SURGERY     benign right breast   COLONOSCOPY  12/2012   polyps benign, diverticulosis, hemorrhoids, recheck in 3 years   ESOPHAGOGASTRODUODENOSCOPY     normal esophagus,gastritis, duodenal erosions without bleeding, bx benign   ESOPHAGOGASTRODUODENOSCOPY (EGD) WITH PROPOFOL N/A 01/22/2021   Procedure: ESOPHAGOGASTRODUODENOSCOPY (EGD) WITH PROPOFOL;  Surgeon: Harvel Quale, MD;  [facility];  Service: Gastroenterology;  Laterality: N/A;     OB History   No obstetric history on file.     Family History  Problem Relation Age of Onset   Ovarian cancer Maternal Aunt        deceased   Juvenile Diabetes Maternal Aunt    Breast cancer Maternal Aunt    Cancer Maternal Grandmother        type unknown   Breast cancer Sister    Breast cancer Mother        deceased    Social History   Tobacco Use   Smoking status: Former    Packs/day: 0.50    Years: 15.00    Pack years: 7.50    Types: Cigarettes    Quit date: 08/02/2020    Years  since quitting: 0.7   Smokeless tobacco: Never  Vaping Use   Vaping Use: Never used  Substance Use Topics   Alcohol use: Yes    Alcohol/week: 5.0 standard drinks    Types: 5 Glasses of wine per week    Comment: moderate   Drug use: No    Home Medications Prior to Admission medications   Medication Sig Start Date End Date Taking? Authorizing Provider  amLODipine (NORVASC) 2.5 MG tablet Take 2.5 mg by mouth daily. 10/18/20  Yes [provider]  losartan (COZAAR) 100 MG tablet Take 1 tablet (100 mg total) by mouth daily. 12/02/17 04/26/21 Yes Dhungel, Nishant, MD  Multiple Vitamin (MULTIVITAMIN WITH MINERALS) TABS tablet Take 1  tablet by mouth daily.   Yes [provider]  NOVOLOG 100 UNIT/ML injection Inject into the skin See admin instructions. Uses insuliin via pump. 11/21/20  Yes [provider]  metFORMIN (GLUCOPHAGE) 500 MG tablet Take 1 tablet (500 mg total) by mouth 2 (two) times daily with a meal. Patient not taking: Reported on 04/26/2021 12/02/17 12/02/18  Dhungel, Flonnie Overman, MD  omeprazole (PRILOSEC) 40 MG capsule Take 1 capsule (40 mg total) by mouth in the morning and at bedtime. 01/25/21 04/25/21  Murlean Iba, MD    Allergies    Acetaminophen and Lisinopril  Review of Systems   Review of Systems  Constitutional:  Negative for chills and fever.  HENT:  Negative for congestion.   Respiratory:  Negative for shortness of breath.   Cardiovascular:  Negative for chest pain.  Gastrointestinal:  Negative for abdominal pain.  Genitourinary:  Negative for enuresis.  Musculoskeletal:  Negative for back pain and myalgias.  Skin:  Negative for rash.  Neurological:  Negative for dizziness.       Full body shaking   Hematological:  Does not bruise/bleed easily.   Physical Exam Updated Vital Signs BP (!) 160/90   Pulse 92   Temp 98.2 F (36.8 C) (Oral)   Resp 18   Ht 5\' 5"  (1.651 m)   Wt 62.6 kg   SpO2 100%   BMI 22.96 kg/m   Physical Exam Vitals and nursing note reviewed.  Constitutional:      General: She is not in acute distress.    Appearance: She is not ill-appearing.  HENT:     Head: Normocephalic and atraumatic.     Nose: No congestion.  Eyes:     Extraocular Movements: Extraocular movements intact.     Conjunctiva/sclera: Conjunctivae normal.  Cardiovascular:     Rate and Rhythm: Normal rate and regular rhythm.     Pulses: Normal pulses.     Heart sounds: No murmur heard.   No friction rub. No gallop.  Pulmonary:     Effort: No respiratory distress.     Breath sounds: No wheezing, rhonchi or rales.  Abdominal:     Palpations: Abdomen is soft.      Tenderness: There is no abdominal tenderness.  Musculoskeletal:     Right lower leg: No edema.     Left lower leg: No edema.     Comments: Patient has full range of motion, 5 5 strength, in the upper lower extremities, neurovascular fully intact.  Skin:    General: Skin is warm and dry.  Neurological:     Mental Status: She is alert.     GCS: GCS eye subscore is 4. GCS verbal subscore is 5. GCS motor subscore is 6.     Cranial Nerves: Cranial nerves  are intact. No facial asymmetry.     Sensory: Sensation is intact.     Motor: No weakness.     Coordination: Romberg sign negative. Finger-Nose-Finger Test normal.     Comments: Cranial nerves II through XII are grossly intact  Patient is having no difficulty word finding.  Psychiatric:        Mood and Affect: Mood normal.    ED Results / Procedures / Treatments   Labs (all labs ordered are listed, but only abnormal results are displayed) Labs Reviewed  BASIC METABOLIC PANEL - Abnormal; Notable for the following components:      Result Value   CO2 19 (*)    Glucose, Bld 286 (*)    Calcium 8.7 (*)    All other components within normal limits  CBC WITH DIFFERENTIAL/PLATELET - Abnormal; Notable for the following components:   Hemoglobin 11.6 (*)    HCT 35.4 (*)    RDW 17.2 (*)    All other components within normal limits  CBG MONITORING, ED - Abnormal; Notable for the following components:   Glucose-Capillary 191 (*)    All other components within normal limits  TROPONIN I (HIGH SENSITIVITY)    EKG EKG Interpretation  Date/Time:  Wednesday April 26 2021 13:15:08 EDT Ventricular Rate:  85 PR Interval:  165 QRS Duration: 85 QT Interval:  380 QTC Calculation: 452 R Axis:   57 Text Interpretation: Sinus rhythm Baseline wander in lead(s) V1 No significant change since last tracing Confirmed by Isla Pence [phone]) on 04/26/2021 1:32:35 PM  Radiology CT Head Wo Contrast  Result Date: 04/26/2021 CLINICAL DATA:  Recent  seizure activity EXAM: CT HEAD WITHOUT CONTRAST TECHNIQUE: Contiguous axial images were obtained from the base of the skull through the vertex without intravenous contrast. COMPARISON:  01/21/2021 FINDINGS: Brain: No evidence of acute infarction, hemorrhage, hydrocephalus, extra-axial collection or mass lesion/mass effect. Mild atrophic changes are again noted. Vascular: No hyperdense vessel or unexpected calcification. Skull: Normal. Negative for fracture or focal lesion. Sinuses/Orbits: No acute finding. Other: None. IMPRESSION: No acute intracranial abnormality. Electronically Signed   By: Inez Catalina M.D.   On: 04/26/2021 13:48    Procedures Procedures   Medications Ordered in ED Medications  sodium chloride 0.9 % bolus 1,000 mL (0 mLs Intravenous Stopped 04/26/21 1441)    ED Course  I have reviewed the triage vital signs and the nursing notes.  Pertinent labs & imaging results that were available during my care of the patient were reviewed by me and considered in my medical decision making (see chart for details).    MDM Rules/Calculators/A&P                         Initial impression-patient presents with whole body shaking.  She is alert, does not appear in acute stress, vital signs reassuring.  Will obtain basic lab work-up, CT head and further assess.  Work-up-CBC shows normocytic anemia with hemoglobin 11.6 appears to be baseline for patient, BMP shows slight decrease in CO2 of 19, hyperglycemia 286.  CT head negative for acute findings.  EKG sinus without signs of ischemia  Reassessment-nursing staff notified me that patient was shaking again went and assessed the patient she had full body tremors, she was able to track with her eyes, she did not become incontinent, did not bite her tongue.  Shaking lasted approximately 30 seconds and then resolved. she was able to answer my question without difficulty no postictal state.  Patient was reassessed, upon my entering the room she  immediately started to shake, same episode as before lasting approximately 30 seconds, then resolving spontaneously on its own.  There is no postictal state, patient able answer all my questions, no focal deficits present.  Updated patient on lab work and imaging patient agreed for discharge at this time.   Rule out- low suspicion for CVA or intracranial head bleed as patient denies change in vision, paresthesias or weakness to upper lower extremities, no neuro deficits noted on exam, CT head did not reveal any acute findings.  I have low suspicion for seizures as patient not become postictal, she was able to track with her eyes, presentation is atypical of a seizure disorder.  Low suspicion for meningitis as there is no meningeal sign.  My exam.  Low suspicion for ACS or arrhythmias as patient denies chest pain, shortness of breath, no hypoperfusion or fluid overload on exam, EKG sinus without signs of ischemia, for troponin is negative will defer second opponent as patient been chest pain for greater than 12 hours would expect elevation troponin if ACS was present..  Low suspicion for systemic infection as patient is nontoxic-appearing, vital signs reassuring, no obvious source infection noted on exam.     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  Vital signs have remained stable, no indication for hospital admission.  Patient given at home care as well strict return precautions.  Patient verbalized that they understood agreed to said plan.  Final Clinical Impression(s) / ED Diagnoses Final diagnoses:  Tremor    Rx / DC Orders ED Discharge Orders     None        Marcello Fennel, PA-C 04/26/21 1459    Isla Pence, MD 04/26/21 1538

## 2021-04-26 NOTE — ED Provider Notes (Signed)
Emergency Department Provider Note   I have reviewed the triage vital signs and the nursing notes.   HISTORY  Chief Complaint Seizures   HPI Laurie Larson is a 60 y.o. female with past medical history reviewed below presents to the emergency department with return of seizure-like activity today.  She was seen in the emergency department earlier today with episodes of shaking.  These episodes were thought to be atypical for seizure with the patient remaining alert and following some commands during the episodes.  She endorses being awake during the episodes but feeling "like I can't control."  No bowel or bladder incontinence.  No tongue injuries.  Patient had lab work and CT imaging today but was ultimately discharged with plan to follow-up with outpatient neurology.  Patient states she returned home and had another episode.  She feels like there was a gap in time that she cannot recall an EMS report the patient seeming "post ictal" on scene. She drinks EtOH rarely. Denies drugs. Does not take benzodiazepines.    Past Medical History:  Diagnosis Date   Benign tumor of breast    removed   Depressive episode    rersolved   Diabetes mellitus without complication ([place])    Diarrhea    GI Outlaw   GERD (gastroesophageal reflux disease) 04/27/2021   History of stomach ulcers    Hypertension    Rectal bleeding    Symptomatic anemia 01/22/2021    Patient Active Problem List   Diagnosis Date Noted   GERD (gastroesophageal reflux disease) 04/27/2021   Witnessed seizure-like activity ([place]) 04/26/2021   Duodenal ulcer    GI bleed 01/22/2021   Syncope and collapse 01/22/2021   Nausea & vomiting 01/22/2021   Diarrhea 01/22/2021   Symptomatic anemia 01/22/2021   Macrocytic anemia 01/22/2021   Hyperglycemia due to diabetes mellitus ([place]) 01/22/2021   COVID-19 virus infection 01/22/2021   Hypoalbuminemia 01/22/2021   Orthostatic hypotension 01/22/2021   Dehydration 01/22/2021    Essential hypertension 12/02/2017   Hypokalemia 12/02/2017   Tobacco abuse counseling 12/02/2017   DKA, type 2 ([place]) 12/02/2017   DKA (diabetic ketoacidoses) 12/01/2017   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    Past Surgical History:  Procedure Laterality Date   ABDOMINAL HYSTERECTOMY     Total   AUGMENTATION MAMMAPLASTY     BREAST ENHANCEMENT SURGERY     BREAST EXCISIONAL BIOPSY     BREAST SURGERY     benign right breast   COLONOSCOPY  12/2012   polyps benign, diverticulosis, hemorrhoids, recheck in 3 years   ESOPHAGOGASTRODUODENOSCOPY     normal esophagus,gastritis, duodenal erosions without bleeding, bx benign   ESOPHAGOGASTRODUODENOSCOPY (EGD) WITH PROPOFOL N/A 01/22/2021   Procedure: ESOPHAGOGASTRODUODENOSCOPY (EGD) WITH PROPOFOL;  Surgeon: Harvel Quale, MD;  Location: [facility];  Service: Gastroenterology;  Laterality: N/A;    Allergies Acetaminophen and Lisinopril  Family History  Problem Relation Age of Onset   Ovarian cancer Maternal Aunt        deceased   Juvenile Diabetes Maternal Aunt    Breast cancer Maternal Aunt    Cancer Maternal Grandmother        type unknown   Breast cancer Sister    Breast cancer Mother        deceased    Social History Social History   Tobacco Use   Smoking status: Former    Packs/day: 0.50    Years: 15.00    Pack years: 7.50    Types: Cigarettes  Quit date: 08/02/2020    Years since quitting: 0.7   Smokeless tobacco: Never  Vaping Use   Vaping Use: Never used  Substance Use Topics   Alcohol use: Yes    Alcohol/week: 5.0 standard drinks    Types: 5 Glasses of wine per week    Comment: moderate   Drug use: No    Review of Systems  Constitutional: No fever/chills Eyes: No visual changes. ENT: No sore throat. Cardiovascular: Denies chest pain. Respiratory: Denies shortness of breath. Gastrointestinal: No abdominal pain.  No nausea, no vomiting.  No diarrhea.  No constipation. Genitourinary:  Negative for dysuria. Musculoskeletal: Negative for back pain. Skin: Negative for rash. Neurological: Negative for headaches, focal weakness or numbness. Positive seizure like activity.   10-point ROS otherwise negative.  ____________________________________________   PHYSICAL EXAM:  VITAL SIGNS: ED Triage Vitals  Enc Vitals Group     BP 04/26/21 1841 (!) 157/90     Pulse Rate 04/26/21 1841 99     Resp 04/26/21 1841 16     Temp 04/26/21 1841 97.7 F (36.5 C)     Temp Source 04/26/21 1841 Oral     SpO2 04/26/21 1841 96 %     Weight 04/26/21 1839 138 lb (62.6 kg)     Height 04/26/21 1839 5\' 5"  (1.651 m)   Constitutional: Alert and oriented. Well appearing and in no acute distress. Eyes: Conjunctivae are normal. PERRL. EOMI. Head: Atraumatic. Nose: No congestion/rhinnorhea. Mouth/Throat: Mucous membranes are moist.  Neck: No stridor.   Cardiovascular: Normal rate, regular rhythm. Good peripheral circulation. Grossly normal heart sounds.   Respiratory: Normal respiratory effort.   Gastrointestinal: Soft and nontender. No distention.  Musculoskeletal: No gross deformities of extremities. Neurologic:  Normal speech and language. No gross focal neurologic deficits are appreciated.  Skin:  Skin is warm, dry and intact. No rash noted.   ____________________________________________   LABS (all labs ordered are listed, but only abnormal results are displayed)  Reviewed labs from ED presentation just hours prior.  ____________________________________________  EKG  None   ____________________________________________  RADIOLOGY  CT Head Wo Contrast  Result Date: 04/26/2021 CLINICAL DATA:  Recent seizure activity EXAM: CT HEAD WITHOUT CONTRAST TECHNIQUE: Contiguous axial images were obtained from the base of the skull through the vertex without intravenous contrast. COMPARISON:  01/21/2021 FINDINGS: Brain: No evidence of acute infarction, hemorrhage, hydrocephalus,  extra-axial collection or mass lesion/mass effect. Mild atrophic changes are again noted. Vascular: No hyperdense vessel or unexpected calcification. Skull: Normal. Negative for fracture or focal lesion. Sinuses/Orbits: No acute finding. Other: None. IMPRESSION: No acute intracranial abnormality. Electronically Signed   By: Inez Catalina M.D.   On: 04/26/2021 13:48    ____________________________________________   PROCEDURES  Procedure(s) performed:   Procedures  None  ____________________________________________   INITIAL IMPRESSION / ASSESSMENT AND PLAN / ED COURSE  Pertinent labs & imaging results that were available during my care of the patient were reviewed by me and considered in my medical decision making (see chart for details).   Patient presents to the emergency department with continued seizure-like episodes.  I was able to discuss these episodes with the provider who saw the patient earlier in the day.  Seemed consistent with nonepilepileptiform seizure activity.  Patient is having some occasional myoclonic jerking while I am in the room but no additional, more prolonged seizure activity for evaluation.  CT imaging of the head along with lab work reviewed.  We will have teleneurology evaluate on screen for further  recommendations.  Patient does not have a prior history of seizure or nonepileptiform seizure diagnosis  08:33 PM  Spoke with the teleneurology service after their evaluation.  They witnessed several events in the exam room that are not consistent with epileptiform seizure.  The neurologist is concerned, however, regarding the event witnessed by the husband with some more consistent seizure-like description.  Will hold on antiepileptics with none of these episodes witnessed in the emergency department.  Recommends MRI and routine EEG.  Will ultimately need outpatient EEG and neurology follow-up as well.   Discussed patient's case with [facility] to request admission. Patient  and family (if present) updated with plan. Care transferred to [facility] service.  I reviewed all nursing notes, vitals, pertinent old records, EKGs, labs, imaging (as available).  ____________________________________________  FINAL CLINICAL IMPRESSION(S) / ED DIAGNOSES  Final diagnoses:  Seizure-like activity ([place])     MEDICATIONS GIVEN DURING THIS VISIT:  Medications  lactated ringers infusion ( Intravenous New Bag/Given 04/27/21 2048)  LORazepam (ATIVAN) injection 1 mg (1 mg Intravenous Given 04/26/21 2150)  sodium chloride 0.9 % bolus 1,000 mL (0 mLs Intravenous Stopped 04/26/21 2330)  gadobutrol (GADAVIST) 1 MMOL/ML injection 6 mL (6 mLs Intravenous Contrast Given 04/27/21 0810)     NEW OUTPATIENT MEDICATIONS STARTED DURING THIS VISIT:  Discharge Medication List as of 04/28/2021  1:02 PM     START taking these medications   Details  diazepam (VALIUM) 5 MG tablet Take 1 tablet (5 mg total) by mouth every 8 (eight) hours as needed (seizures)., Starting Fri 04/28/2021, Normal    divalproex (DEPAKOTE) 250 MG DR tablet Take 1 tablet (250 mg total) by mouth every 8 (eight) hours., Starting Fri 04/28/2021, Until Sun 05/28/2021, Normal        Note:  This document was prepared using Dragon voice recognition software and may include unintentional dictation errors.  Nanda Quinton, MD, [facility] Emergency Medicine    Carlethia Mesquita, Wonda [facility], MD 04/29/21 1125

## 2021-04-26 NOTE — ED Triage Notes (Signed)
Pt to er room number 5, pt states that yesterday she was having a low blood sugar of 40, states that today she is having elevated blood sugars in the 200s, bs at this time is 191, states that she is having these sporadic episodes of arm shaking, states that she has had a seizure in the past, but this is different.  Awake and answering questions appropriately even through episodes of arm shaking.

## 2021-04-26 NOTE — ED Notes (Signed)
Recliner provided to husband as requested

## 2021-04-26 NOTE — ED Notes (Signed)
Pt POC blood glucose was 293, pt and spouse made aware, pt self-dosed 2.1 units insulin. EDP made aware, no new orders. Pt up to bathroom, urine requested, returned to room. Steady gait observed. Orders to be placed for urine by EDP.

## 2021-04-26 NOTE — ED Notes (Signed)
Pt medicated per MAR, VS updated, husband remains at bedside. IVF infusing without difficulty. Pt remains on cardiac monitoring and pulse ox. Bed locked and low, call bell within reach. Will continue to monitor.

## 2021-04-27 ENCOUNTER — Encounter (HOSPITAL_COMMUNITY): Payer: Self-pay | Admitting: Internal Medicine

## 2021-04-27 ENCOUNTER — Observation Stay (HOSPITAL_COMMUNITY): Payer: 59

## 2021-04-27 ENCOUNTER — Observation Stay (HOSPITAL_COMMUNITY)
Admit: 2021-04-27 | Discharge: 2021-04-27 | Disposition: A | Discharge status: Home / Self Care | Payer: 59 | Attending: Internal Medicine | Admitting: Internal Medicine

## 2021-04-27 DIAGNOSIS — I10 Essential (primary) hypertension: Secondary | ICD-10-CM | POA: Diagnosis not present

## 2021-04-27 DIAGNOSIS — R569 Unspecified convulsions: Secondary | ICD-10-CM

## 2021-04-27 DIAGNOSIS — K219 Gastro-esophageal reflux disease without esophagitis: Secondary | ICD-10-CM

## 2021-04-27 HISTORY — DX: Gastro-esophageal reflux disease without esophagitis: K21.9

## 2021-04-27 LAB — GLUCOSE, CAPILLARY
Glucose-Capillary: 166 mg/dL — ABNORMAL HIGH (ref 70–99)
Glucose-Capillary: 179 mg/dL — ABNORMAL HIGH (ref 70–99)
Glucose-Capillary: 148 mg/dL — ABNORMAL HIGH (ref 70–99)

## 2021-04-27 LAB — COMPREHENSIVE METABOLIC PANEL
ALT: 17 U/L (ref 0–44)
AST: 22 U/L (ref 15–41)
Albumin: 3.3 g/dL — ABNORMAL LOW (ref 3.5–5.0)
Alkaline Phosphatase: 79 U/L (ref 38–126)
Anion gap: 6 (ref 5–15)
BUN: 8 mg/dL (ref 6–20)
CO2: 23 mmol/L (ref 22–32)
Calcium: 8.7 mg/dL — ABNORMAL LOW (ref 8.9–10.3)
Chloride: 108 mmol/L (ref 98–111)
Creatinine, Ser: 0.62 mg/dL (ref 0.44–1.00)
GFR, Estimated: >60 mL/min (ref >60)
Glucose, Bld: 77 mg/dL (ref 70–99)
Potassium: 3.5 mmol/L (ref 3.5–5.1)
Sodium: 137 mmol/L (ref 135–145)
Total Bilirubin: 0.5 mg/dL (ref 0.3–1.2)
Total Protein: 6.1 g/dL — ABNORMAL LOW (ref 6.5–8.1)

## 2021-04-27 LAB — PROTIME-INR
INR: 1 (ref 0.8–1.2)
Prothrombin Time: 13.6 seconds (ref 11.4–15.2)

## 2021-04-27 LAB — CBC
HCT: 34.5 % — ABNORMAL LOW (ref 36.0–46.0)
Hemoglobin: 10.8 g/dL — ABNORMAL LOW (ref 12.0–15.0)
MCH: 28.6 pg (ref 26.0–34.0)
MCHC: 31.3 g/dL (ref 30.0–36.0)
MCV: 91.5 fL (ref 80.0–100.0)
Platelets: 164 10*3/uL (ref 150–400)
RBC: 3.77 MIL/uL — ABNORMAL LOW (ref 3.87–5.11)
RDW: 18 % — ABNORMAL HIGH (ref 11.5–15.5)
WBC: 5.3 10*3/uL (ref 4.0–10.5)
nRBC: 0 % (ref 0.0–0.2)

## 2021-04-27 LAB — APTT: aPTT: 29 seconds (ref 24–36)

## 2021-04-27 MED ORDER — PANTOPRAZOLE SODIUM 40 MG PO TBEC
40.0000 mg | DELAYED_RELEASE_TABLET | Freq: Every day | ORAL | Status: DC
  Administered 2021-04-27 – 2021-04-28 (×2): 40 mg via ORAL
  Filled 2021-04-27 (×2): qty 1

## 2021-04-27 MED ORDER — GADOBUTROL 1 MMOL/ML IV SOLN
6.0000 mL | Freq: Once | INTRAVENOUS | Status: AC | PRN
  Administered 2021-04-27: 6 mL via INTRAVENOUS

## 2021-04-27 MED ORDER — AMLODIPINE BESYLATE 5 MG PO TABS
2.5000 mg | ORAL_TABLET | Freq: Every day | ORAL | Status: DC
  Administered 2021-04-27 – 2021-04-28 (×2): 2.5 mg via ORAL
  Filled 2021-04-27 (×2): qty 1

## 2021-04-27 MED ORDER — INSULIN PUMP
Freq: Three times a day (TID) | SUBCUTANEOUS | Status: DC
  Filled 2021-04-27: qty 1

## 2021-04-27 MED ORDER — LACTATED RINGERS IV SOLN: INTRAVENOUS | Status: AC

## 2021-04-27 MED ORDER — LOSARTAN POTASSIUM 50 MG PO TABS
100.0000 mg | ORAL_TABLET | Freq: Every day | ORAL | Status: DC
  Administered 2021-04-27 – 2021-04-28 (×2): 100 mg via ORAL
  Filled 2021-04-27 (×2): qty 2

## 2021-04-27 MED ORDER — LORAZEPAM 2 MG/ML IJ SOLN
2.0000 mg | INTRAMUSCULAR | Status: DC | PRN
  Administered 2021-04-27 – 2021-04-28 (×3): 2 mg via INTRAVENOUS
  Filled 2021-04-27 (×3): qty 1

## 2021-04-27 NOTE — Procedures (Signed)
Patient Name: Laurie Larson  MRN: [id number]  Epilepsy Attending: Lora Havens  Referring Physician/Provider: Dr Nicanor Bake Date: 04/27/2021 Duration: 25.58 mins  Patient history: 59yo F with seizure like activity. EEG to evaluate for seizure  Level of alertness: Awake, asleep  AEDs during EEG study: Ativan  Technical aspects: This EEG study was done with scalp electrodes positioned according to the 10-20 International system of electrode placement. Electrical activity was acquired at a sampling rate of 500Hz  and reviewed with a high frequency filter of 70Hz  and a low frequency filter of 1Hz . EEG data were recorded continuously and digitally stored.   Description: No posterior dominant rhythm was seen. Sleep was characterized by vertex waves, sleep spindles (12 to 14 Hz), maximal frontocentral region.  There is an excessive amount of 15 to 18 Hz beta activity distributed symmetrically and diffusely. Hyperventilation did not show any EEG change.  Physiologic photic driving was not seen during photic stimulation.    ABNORMALITY - Excessive beta, generalized  IMPRESSION: This study is within normal limits. No seizures or epileptiform discharges were seen throughout the recording. The excessive beta activity seen in the background is most likely due to the effect of benzodiazepine and is a benign EEG pattern.   Laurie Larson

## 2021-04-27 NOTE — Progress Notes (Signed)
EEG Completed; Results Pending  

## 2021-04-27 NOTE — Progress Notes (Signed)
Inpatient Diabetes Program Recommendations  AACE/ADA: New Consensus Statement on Inpatient Glycemic Control (2015)  Target Ranges:  Prepandial:   less than 140 mg/dL      Peak postprandial:   less than 180 mg/dL (1-2 hours)      Critically ill patients:  140 - 180 mg/dL   Lab Results  Component Value Date   GLUCAP 166 (H) 04/27/2021   HGBA1C 6.1 (H) 01/21/2021    Review of Glycemic Control Results for Laurie Larson, Laurie Larson (MRN [id number]) as of 04/27/2021 12:14  Ref. Range 04/26/2021 23:20 04/27/2021 11:18  Glucose-Capillary Latest Ref Range: 70 - 99 mg/dL 140 (H) 166 (H)   Diabetes history: LADA (makes NO insulin; requires basal, correction, and carbohydrate coverage insulin) Outpatient Diabetes medications: Medtronic Insulin Pump with Novolog, Levemir 7 units QHS (for basal when not using pump) Current orders for Inpatient glycemic control: None   Inpatient Diabetes Program Recommendations:     Insulin: Please order Insulin Pump order set with CBGs AC&HS, and 2am.   NOTE: Per chart review, patient has Type 1 DM and uses an insulin pump for DM management. Noted patient sees Dr. Garrel Ridgel with [facility] (last visit 12/28/20). Per note on 12/28/20, the following should be insulin pump settings: Basal 0.3 units for 24 hours (7.2 units per 24 hours for basal)   Insulin to Carb Ratio 12A 1:9 (1 unit for every 9 grams of carbs) 12P 1:10 (I unit for every 10 grams of carbs)   Insulin Sensitivity 1:80 (1 unit drops glucose 80 mg/dl)   Target Glucose 120 mg/dl  Thanks, Bronson Curb, MSN, RNC-OB Diabetes Coordinator [phone] (8a-5p)

## 2021-04-27 NOTE — Progress Notes (Signed)
PROGRESS NOTE    Laurie Larson  CXK:[id number] DOB: March 04, 1961 DOA: 04/26/2021 PCP: Aretta Nip, MD   Brief Narrative:   Laurie Larson is a 60 y.o. female with medical history significant for  T2DM on insulin pump, hypertension, GERD who presents to the emergency department due to seizure-like activity at home.  She had multiple repeat episodes and even had some episodes in the hospital, but appeared to be alert throughout the course of these spells.  Brain MRI without any acute findings and EEG is currently pending.  Neurology consultation has been requested.  Ativan ordered as needed for any seizure activity.  Assessment & Plan:   Principal Problem:   Witnessed seizure-like activity ([place]) Active Problems:   Essential hypertension   Hyperglycemia due to diabetes mellitus (HCC)   GERD (gastroesophageal reflux disease)   Witnessed seizure-like activity -Nonspecific findings noted on brain MRI and CT negative -EEG pending -Ativan as needed for seizure activity -Appreciate neurology evaluation  Type 2 diabetes with insulin pump -Continue insulin pump and close monitoring  GERD -Protonix  Essential hypertension -Norvasc and Cozaar   DVT prophylaxis: Lovenox Code Status: Full Family Communication: None at bedside Disposition Plan:  Status is: Observation  The patient will require care spanning > 2 midnights and should be moved to inpatient because: Ongoing diagnostic testing needed not appropriate for outpatient work up  Dispo: The patient is from: Home              Anticipated d/c is to: Home              Patient currently is not medically stable to d/c.   Difficult to place patient No   Consultants:  Neurology  Procedures:  See below  Antimicrobials:  None   Subjective: Patient seen and evaluated today with no new acute complaints or concerns. No acute concerns or events noted overnight.  Objective: Vitals:   04/26/21 2130  04/26/21 2300 04/27/21 0426 04/27/21 0853  BP: (!) 158/93 (!) 142/87 (!) 151/92 (!) 156/91  Pulse: 76 80 68 62  Resp: 14 15 14 16   Temp:   98 F (36.7 C) 98.1 F (36.7 C)  TempSrc:   Oral Oral  SpO2: 98% 97% 99% 100%  Weight:      Height:        Intake/Output Summary (Last 24 hours) at 04/27/2021 1507 Last data filed at 04/27/2021 1300 Gross per 24 hour  Intake 240 ml  Output --  Net 240 ml   Filed Weights   04/26/21 1839  Weight: 62.6 kg    Examination:  General exam: Appears calm and comfortable  Respiratory system: Clear to auscultation. Respiratory effort normal. Cardiovascular system: S1 & S2 heard, RRR.  Gastrointestinal system: Abdomen is soft Central nervous system: Alert and awake Extremities: No edema Skin: No significant lesions noted Psychiatry: Flat affect.    Data Reviewed: I have personally reviewed following labs and imaging studies  CBC: Recent Labs  Lab 04/26/21 1304 04/27/21 0614  WBC 5.8 5.3  NEUTROABS 4.2  --   HGB 11.6* 10.8*  HCT 35.4* 34.5*  MCV 90.1 91.5  PLT 177 970   Basic Metabolic Panel: Recent Labs  Lab 04/26/21 1304 04/27/21 0614  NA 135 137  K 4.0 3.5  CL 105 108  CO2 19* 23  GLUCOSE 286* 77  BUN 10 8  CREATININE 0.62 0.62  CALCIUM 8.7* 8.7*   GFR: Estimated Creatinine Clearance: 69.8 mL/min (by C-G formula based on SCr of 0.62  mg/dL). Liver Function Tests: Recent Labs  Lab 04/27/21 0614  AST 22  ALT 17  ALKPHOS 79  BILITOT 0.5  PROT 6.1*  ALBUMIN 3.3*   No results for input(s): LIPASE, AMYLASE in the last 168 hours. No results for input(s): AMMONIA in the last 168 hours. Coagulation Profile: Recent Labs  Lab 04/27/21 0614  INR 1.0   Cardiac Enzymes: No results for input(s): CKTOTAL, CKMB, CKMBINDEX, TROPONINI in the last 168 hours. BNP (last 3 results) No results for input(s): PROBNP in the last 8760 hours. HbA1C: No results for input(s): HGBA1C in the last 72 hours. CBG: Recent Labs  Lab  04/26/21 1235 04/26/21 2022 04/26/21 2320 04/27/21 1118  GLUCAP 191* 293* 140* 166*   Lipid Profile: No results for input(s): CHOL, HDL, LDLCALC, TRIG, CHOLHDL, LDLDIRECT in the last 72 hours. Thyroid Function Tests: No results for input(s): TSH, T4TOTAL, FREET4, T3FREE, THYROIDAB in the last 72 hours. Anemia Panel: No results for input(s): VITAMINB12, FOLATE, FERRITIN, TIBC, IRON, RETICCTPCT in the last 72 hours. Sepsis Labs: No results for input(s): PROCALCITON, LATICACIDVEN in the last 168 hours.  Recent Results (from the past 240 hour(s))  Resp Panel by RT-PCR (Flu A&B, Covid) Nasopharyngeal Swab     Status: None   Collection Time: 04/26/21  9:02 PM   Specimen: Nasopharyngeal Swab; Nasopharyngeal(NP) swabs in vial transport medium  Result Value Ref Range Status   SARS Coronavirus 2 by RT PCR NEGATIVE NEGATIVE Final    Comment: (NOTE) SARS-CoV-2 target nucleic acids are NOT DETECTED.  The SARS-CoV-2 RNA is generally detectable in upper respiratory specimens during the acute phase of infection. The lowest concentration of SARS-CoV-2 viral copies this assay can detect is 138 copies/mL. A negative result does not preclude SARS-Cov-2 infection and should not be used as the sole basis for treatment or other patient management decisions. A negative result may occur with  improper specimen collection/handling, submission of specimen other than nasopharyngeal swab, presence of viral mutation(s) within the areas targeted by this assay, and inadequate number of viral copies(<138 copies/mL). A negative result must be combined with clinical observations, patient history, and epidemiological information. The expected result is Negative.  Fact Sheet for Patients:  EntrepreneurPulse.com.au  Fact Sheet for Healthcare Providers:  IncredibleEmployment.be  This test is no t yet approved or cleared by the [place] FDA and  has been authorized for  detection and/or diagnosis of SARS-CoV-2 by FDA under an Emergency Use Authorization (EUA). This EUA will remain  in effect (meaning this test can be used) for the duration of the COVID-19 declaration under Section 564(b)(1) of the Act, 21 U.S.C.section 360bbb-3(b)(1), unless the authorization is terminated  or revoked sooner.       Influenza A by PCR NEGATIVE NEGATIVE Final   Influenza B by PCR NEGATIVE NEGATIVE Final    Comment: (NOTE) The Xpert Xpress SARS-CoV-2/FLU/RSV plus assay is intended as an aid in the diagnosis of influenza from Nasopharyngeal swab specimens and should not be used as a sole basis for treatment. Nasal washings and aspirates are unacceptable for Xpert Xpress SARS-CoV-2/FLU/RSV testing.  Fact Sheet for Patients: EntrepreneurPulse.com.au  Fact Sheet for Healthcare Providers: IncredibleEmployment.be  This test is not yet approved or cleared by the [place] FDA and has been authorized for detection and/or diagnosis of SARS-CoV-2 by FDA under an Emergency Use Authorization (EUA). This EUA will remain in effect (meaning this test can be used) for the duration of the COVID-19 declaration under Section 564(b)(1) of the Act, 21 U.S.C.  section 360bbb-3(b)(1), unless the authorization is terminated or revoked.  Performed at [facility], [address], [place], [place] [address]          Radiology Studies: CT Head Wo Contrast  Result Date: 04/26/2021 CLINICAL DATA:  Recent seizure activity EXAM: CT HEAD WITHOUT CONTRAST TECHNIQUE: Contiguous axial images were obtained from the base of the skull through the vertex without intravenous contrast. COMPARISON:  01/21/2021 FINDINGS: Brain: No evidence of acute infarction, hemorrhage, hydrocephalus, extra-axial collection or mass lesion/mass effect. Mild atrophic changes are again noted. Vascular: No hyperdense vessel or unexpected calcification. Skull: Normal. Negative for  fracture or focal lesion. Sinuses/Orbits: No acute finding. Other: None. IMPRESSION: No acute intracranial abnormality. Electronically Signed   By: Inez Catalina M.D.   On: 04/26/2021 13:48   MR BRAIN W WO CONTRAST  Addendum Date: 04/27/2021   ADDENDUM REPORT: 04/27/2021 11:16 ADDENDUM: Axial FLAIR imaging was obtained, which confirms mild T2/FLAIR hyperintensities in the white matter (as discussed in the initial report). No new findings. Electronically Signed   By: Margaretha Sheffield MD   On: 04/27/2021 11:16   Result Date: 04/27/2021 CLINICAL DATA:  Neuro deficit, acute stroke suspected. EXAM: MRI HEAD WITHOUT AND WITH CONTRAST TECHNIQUE: Multiplanar, multiecho pulse sequences of the brain and surrounding structures were obtained without and with intravenous contrast. CONTRAST:  15mL GADAVIST GADOBUTROL 1 MMOL/ML IV SOLN COMPARISON:  CT head April 26, 2021. FINDINGS: Incomplete exam.  Axial FLAIR imaging was not performed. Brain: No acute infarction, acute hemorrhage, hydrocephalus, extra-axial collection or mass lesion. Asymmetric susceptibility artifact in the basal ganglia, which may represent mineralization versus hemosiderin from prior hemorrhage. Mineralization is favored given faint hyperdensity on recent CT head exams. No T2 hypointensity or abnormal enhancement this region to suggest vascular malformation. No edema to suggest acute hemorrhage. No abnormal enhancement elsewhere. The hippocampi are symmetric in size/appearance and within normal limits. Mild scattered T2 hyperintensities in the white matter. Tiny remote lacunar infarct in the right cerebellum. Vascular: Major arterial flow voids are maintained at the skull base. Skull and upper cervical spine: Normal marrow signal. Sinuses/Orbits: Mild mucosal thickening.  Unremarkable orbits. Other: No mastoid effusions. IMPRESSION: 1. No evidence of acute intracranial abnormality on this incomplete exam (axial FLAIR imaging was not performed). The MRI  scanner is currently down, but the patient will return to complete the exam when the MRI is functional and an addendum will be added to this report. 2. Asymmetric mineralization (favored) versus prior hemorrhage in the left basal ganglia. 3. Mild scattered T2 hyperintensities in the white matter, which are nonspecific but most likely related to chronic microvascular disease. Tiny remote lacunar infarct in the right cerebellum. Electronically Signed: By: Margaretha Sheffield MD On: 04/27/2021 08:59        Scheduled Meds:  amLODipine  2.5 mg Oral Daily   enoxaparin (LOVENOX) injection  40 mg Subcutaneous Q24H   insulin pump   Subcutaneous TID WC, HS, 0200   losartan  100 mg Oral Daily   pantoprazole  40 mg Oral Daily     LOS: 0 days    Time spent: 35 minutes    Darling Cieslewicz Darleen Crocker, DO [facility]  If 7PM-7AM, please contact night-coverage [email or web address] 04/27/2021, 3:07 PM

## 2021-04-28 DIAGNOSIS — I10 Essential (primary) hypertension: Secondary | ICD-10-CM | POA: Diagnosis not present

## 2021-04-28 LAB — GLUCOSE, CAPILLARY
Glucose-Capillary: 128 mg/dL — ABNORMAL HIGH (ref 70–99)
Glucose-Capillary: 161 mg/dL — ABNORMAL HIGH (ref 70–99)
Glucose-Capillary: 196 mg/dL — ABNORMAL HIGH (ref 70–99)

## 2021-04-28 MED ORDER — DIAZEPAM 5 MG PO TABS: 5.0000 mg | ORAL_TABLET | Freq: Three times a day (TID) | ORAL | 0 refills | Status: AC | PRN

## 2021-04-28 MED ORDER — DIVALPROEX SODIUM 250 MG PO DR TAB: 250.0000 mg | DELAYED_RELEASE_TABLET | Freq: Three times a day (TID) | ORAL | 1 refills | Status: AC

## 2021-04-28 MED ORDER — DIAZEPAM 5 MG PO TABS: 5.0000 mg | ORAL_TABLET | Freq: Three times a day (TID) | ORAL | Status: DC | PRN

## 2021-04-28 MED ORDER — DIVALPROEX SODIUM 250 MG PO DR TAB: 250.0000 mg | DELAYED_RELEASE_TABLET | Freq: Three times a day (TID) | ORAL | Status: DC

## 2021-04-28 NOTE — Consult Note (Signed)
[place] A. Merlene Laughter, MD     [email or web address]          Laurie Larson is an 60 y.o. female.   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 The patient will be started on diazepam to be taken on as-needed basis. Single episode that is thought to be true seizure but at this time it is unclear if provoked or unprovoked.  Given the hypo glycemia, suspect that this seizure is provoked. Given her overall medical condition, she was treated with low-dose medication. Depakote is preferred this time to provide the mood stability. Long-term outpatient EEG will also be arranged. Hypoglycemia of 40 or less     This is a 60 year old white female who presents with multiple episodes of convulsive seizures. She has late onset type 1 diabetes mellitus. She reports that at onset of the event see did not feel right and checked her blood sugar it was at least 40 and possibly low. She did consume fruit juice to help with her symptoms and about a couple hours after started having generalized shaking events lasting about 1-2 minutes. She reports having multiple these events for over 12 hours. The events were witnessed by her husband. There is seemed consistent with the shaking / convulsive episodes. She does not report alteration of consciousness or loss of consciousness during these events. She was seen emergency room treated and released. She had another event which led her to return to the emergency room. The 1 event which was concerning was when she lost consciousness and appeared to have stiffening/tonic activity without convulsions. She did bite her tongue on both sides and was unconscious for about a minute or so. She would is amnestic to most of this event. She reports no new medications. She does not report a prior history of seizures. She does not report focal numbness, weakness or chest pain although she did have some dyspnea with these events.  She does have occipital headaches are low-grade at the a chronic issue.  She reports no significant psychosocial stresses. She reports that her husband and her came back from vacation recently.The review systems otherwise negative.    ADEESO:  Laurie Larson is a 60 y.o. female with medical history significant for  T2DM on insulin pump, hypertension, GERD who presents to the emergency department due to seizure-like activity at home today.  Patient complained of an episode of shaking around 11 AM this morning, so she presented to the ED after being evaluated, her presentation was depicted to be atypical, since patient was alert but felt like she was unable to control the shaking, she was discharged home.  She had another episode at home around 5:30 PM per husband at bedside, during which patient had another seizure-like activity, but this time, she was unconscious of the activity and she had about 30 minutes of postictal confusion prior to to her baseline.  No biting of tongue, bowel or urinary incontinence reported.  EMS was activated and she was taken to the ED for reevaluation of symptoms.  Patient denies habitual alcohol consumption or benzodiazepine use.     GENERAL:  this is a thin pleasant female who is doing well at this time.  HEENT:  Neck is supple. There is teeth by Trauma involving the tongue on both sides especially on the right side.  ABDOMEN: soft  EXTREMITIES: No edema   BACK: Normal  SKIN: Normal by inspection.    MENTAL STATUS: Alert and oriented. Speech, language and cognition are  generally intact. Judgment and insight normal.   CRANIAL NERVES: Pupils are equal, round and reactive to light and accomodation; extra ocular movements are full, there is no significant nystagmus; visual fields are full; upper and lower facial muscles are normal in strength and symmetric, there is no flattening of the nasolabial folds; tongue is midline; uvula is midline; shoulder elevation  is normal.  MOTOR: Normal tone, bulk and strength; no pronator drift.  COORDINATION: Left finger to nose is normal, right finger to nose is normal, No rest tremor; no intention tremor; no postural tremor; no bradykinesia.  REFLEXES: Deep tendon reflexes are symmetrical and slightly diminished. Plantar reflexes are flexor bilaterally.   SENSATION: Normal to light touch, temperature, and pain. There is no extension on double simultaneous stimulation.  GAIT: Normal.    Blood pressure (!) 164/73, pulse 70, temperature 98.7 F (37.1 C), temperature source Oral, resp. rate 16, height 5\' 5"  (1.651 m), weight 62.6 kg, SpO2 98 %.  Past Medical History:  Diagnosis Date   Benign tumor of breast    removed   Depressive episode    rersolved   Diabetes mellitus without complication ([place])    Diarrhea    GI Outlaw   GERD (gastroesophageal reflux disease) 04/27/2021   History of stomach ulcers    Hypertension    Rectal bleeding    Symptomatic anemia 01/22/2021    Past Surgical History:  Procedure Laterality Date   ABDOMINAL HYSTERECTOMY     Total   AUGMENTATION MAMMAPLASTY     BREAST ENHANCEMENT SURGERY     BREAST EXCISIONAL BIOPSY     BREAST SURGERY     benign right breast   COLONOSCOPY  12/2012   polyps benign, diverticulosis, hemorrhoids, recheck in 3 years   ESOPHAGOGASTRODUODENOSCOPY     normal esophagus,gastritis, duodenal erosions without bleeding, bx benign   ESOPHAGOGASTRODUODENOSCOPY (EGD) WITH PROPOFOL N/A 01/22/2021   Procedure: ESOPHAGOGASTRODUODENOSCOPY (EGD) WITH PROPOFOL;  Surgeon: Harvel Quale, MD;  Location: [facility];  Service: Gastroenterology;  Laterality: N/A;    Family History  Problem Relation Age of Onset   Ovarian cancer Maternal Aunt        deceased   Juvenile Diabetes Maternal Aunt    Breast cancer Maternal Aunt    Cancer Maternal Grandmother        type unknown   Breast cancer Sister    Breast cancer Mother        deceased     Social History:  reports that she quit smoking about 8 months ago. Her smoking use included cigarettes. She has a 7.50 pack-year smoking history. She has never used smokeless tobacco. She reports current alcohol use of about 5.0 standard drinks of alcohol per week. She reports that she does not use drugs.  Allergies:  Allergies  Allergen Reactions   Acetaminophen Anaphylaxis   Lisinopril Hives and Swelling    Medications: Prior to Admission medications   Medication Sig Start Date End Date Taking? Authorizing Provider  amLODipine (NORVASC) 2.5 MG tablet Take 2.5 mg by mouth daily. 10/18/20  Yes [provider]  losartan (COZAAR) 100 MG tablet Take 1 tablet (100 mg total) by mouth daily. 12/02/17 04/26/21 Yes Dhungel, Nishant, MD  Multiple Vitamin (MULTIVITAMIN WITH MINERALS) TABS tablet Take 1 tablet by mouth daily.   Yes [provider]  NOVOLOG 100 UNIT/ML injection Inject into the skin See admin instructions. Uses insuliin via pump. 11/21/20  Yes [provider]  metFORMIN (GLUCOPHAGE) 500 MG tablet Take 1  tablet (500 mg total) by mouth 2 (two) times daily with a meal. Patient not taking: Reported on 04/26/2021 12/02/17 12/02/18  Dhungel, Flonnie Overman, MD  omeprazole (PRILOSEC) 40 MG capsule Take 1 capsule (40 mg total) by mouth in the morning and at bedtime. 01/25/21 04/25/21  Murlean Iba, MD    Scheduled Meds:  amLODipine  2.5 mg Oral Daily   enoxaparin (LOVENOX) injection  40 mg Subcutaneous Q24H   insulin pump   Subcutaneous TID WC, HS, 0200   losartan  100 mg Oral Daily   pantoprazole  40 mg Oral Daily   Continuous Infusions:  lactated ringers 75 mL/hr at 04/27/21 2048   PRN Meds:.LORazepam     Results for orders placed or performed during the hospital encounter of 04/26/21 (from the past 48 hour(s))  POC CBG, ED     Status: Abnormal   Collection Time: 04/26/21  8:22 PM  Result Value Ref Range   Glucose-Capillary 293 (H) 70 - 99 mg/dL     Comment: Glucose reference range applies only to samples taken after fasting for at least 8 hours.  Resp Panel by RT-PCR (Flu A&B, Covid) Nasopharyngeal Swab     Status: None   Collection Time: 04/26/21  9:02 PM   Specimen: Nasopharyngeal Swab; Nasopharyngeal(NP) swabs in vial transport medium  Result Value Ref Range   SARS Coronavirus 2 by RT PCR NEGATIVE NEGATIVE    Comment: (NOTE) SARS-CoV-2 target nucleic acids are NOT DETECTED.  The SARS-CoV-2 RNA is generally detectable in upper respiratory specimens during the acute phase of infection. The lowest concentration of SARS-CoV-2 viral copies this assay can detect is 138 copies/mL. A negative result does not preclude SARS-Cov-2 infection and should not be used as the sole basis for treatment or other patient management decisions. A negative result may occur with  improper specimen collection/handling, submission of specimen other than nasopharyngeal swab, presence of viral mutation(s) within the areas targeted by this assay, and inadequate number of viral copies(<138 copies/mL). A negative result must be combined with clinical observations, patient history, and epidemiological information. The expected result is Negative.  Fact Sheet for Patients:  EntrepreneurPulse.com.au  Fact Sheet for Healthcare Providers:  IncredibleEmployment.be  This test is no t yet approved or cleared by the [place] FDA and  has been authorized for detection and/or diagnosis of SARS-CoV-2 by FDA under an Emergency Use Authorization (EUA). This EUA will remain  in effect (meaning this test can be used) for the duration of the COVID-19 declaration under Section 564(b)(1) of the Act, 21 U.S.C.section 360bbb-3(b)(1), unless the authorization is terminated  or revoked sooner.       Influenza A by PCR NEGATIVE NEGATIVE   Influenza B by PCR NEGATIVE NEGATIVE    Comment: (NOTE) The Xpert Xpress SARS-CoV-2/FLU/RSV  plus assay is intended as an aid in the diagnosis of influenza from Nasopharyngeal swab specimens and should not be used as a sole basis for treatment. Nasal washings and aspirates are unacceptable for Xpert Xpress SARS-CoV-2/FLU/RSV testing.  Fact Sheet for Patients: EntrepreneurPulse.com.au  Fact Sheet for Healthcare Providers: IncredibleEmployment.be  This test is not yet approved or cleared by the [place] FDA and has been authorized for detection and/or diagnosis of SARS-CoV-2 by FDA under an Emergency Use Authorization (EUA). This EUA will remain in effect (meaning this test can be used) for the duration of the COVID-19 declaration under Section 564(b)(1) of the Act, 21 U.S.C. section 360bbb-3(b)(1), unless the authorization is terminated or revoked.  Performed at  [facility] [place], [place] [address]   Urinalysis, Routine w reflex microscopic Urine, Clean Catch     Status: Abnormal   Collection Time: 04/26/21  9:02 PM  Result Value Ref Range   Color, Urine STRAW (A) YELLOW   APPearance CLEAR CLEAR   Specific Gravity, Urine 1.017 1.005 - 1.030   pH 5.0 5.0 - 8.0   Glucose, UA >=500 (A) NEGATIVE mg/dL   Hgb urine dipstick NEGATIVE NEGATIVE   Bilirubin Urine NEGATIVE NEGATIVE   Ketones, ur 80 (A) NEGATIVE mg/dL   Protein, ur NEGATIVE NEGATIVE mg/dL   Nitrite NEGATIVE NEGATIVE   Leukocytes,Ua NEGATIVE NEGATIVE   RBC / HPF 0-5 0 - 5 RBC/hpf   WBC, UA 0-5 0 - 5 WBC/hpf   Bacteria, UA NONE SEEN NONE SEEN   Squamous Epithelial / LPF 0-5 0 - 5    Comment: Performed at [facility], [address], [place], [place] [address]  Urine rapid drug screen (hosp performed)     Status: None   Collection Time: 04/26/21  9:02 PM  Result Value Ref Range   Opiates NONE DETECTED NONE DETECTED   Cocaine NONE DETECTED NONE DETECTED   Benzodiazepines NONE DETECTED NONE DETECTED   Amphetamines NONE DETECTED NONE DETECTED    Tetrahydrocannabinol NONE DETECTED NONE DETECTED   Barbiturates NONE DETECTED NONE DETECTED    Comment: (NOTE) DRUG SCREEN FOR MEDICAL PURPOSES ONLY.  IF CONFIRMATION IS NEEDED FOR ANY PURPOSE, NOTIFY LAB WITHIN 5 DAYS.  LOWEST DETECTABLE LIMITS FOR URINE DRUG SCREEN Drug Class                     Cutoff (ng/mL) Amphetamine and metabolites    1000 Barbiturate and metabolites    200 Benzodiazepine                 834 Tricyclics and metabolites     300 Opiates and metabolites        300 Cocaine and metabolites        300 THC                            50 Performed at [facility], [address], [place], [place] [address]   CBG monitoring, ED     Status: Abnormal   Collection Time: 04/26/21 11:20 PM  Result Value Ref Range   Glucose-Capillary 140 (H) 70 - 99 mg/dL    Comment: Glucose reference range applies only to samples taken after fasting for at least 8 hours.  Comprehensive metabolic panel     Status: Abnormal   Collection Time: 04/27/21  6:14 AM  Result Value Ref Range   Sodium 137 135 - 145 mmol/L   Potassium 3.5 3.5 - 5.1 mmol/L   Chloride 108 98 - 111 mmol/L   CO2 23 22 - 32 mmol/L   Glucose, Bld 77 70 - 99 mg/dL    Comment: Glucose reference range applies only to samples taken after fasting for at least 8 hours.   BUN 8 6 - 20 mg/dL   Creatinine, Ser 0.62 0.44 - 1.00 mg/dL   Calcium 8.7 (L) 8.9 - 10.3 mg/dL   Total Protein 6.1 (L) 6.5 - 8.1 g/dL   Albumin 3.3 (L) 3.5 - 5.0 g/dL   AST 22 15 - 41 U/L   ALT 17 0 - 44 U/L   Alkaline Phosphatase 79 38 - 126 U/L   Total Bilirubin 0.5  0.3 - 1.2 mg/dL   GFR, Estimated >60 >60 mL/min    Comment: (NOTE) Calculated using the CKD-EPI Creatinine Equation (2021)    Anion gap 6 5 - 15    Comment: Performed at [facility], [address] [place], [place] [address]  CBC     Status: Abnormal   Collection Time: 04/27/21  6:14 AM  Result Value Ref Range   WBC 5.3 4.0 - 10.5 K/uL   RBC 3.77 (L) 3.87 - 5.11 MIL/uL    Hemoglobin 10.8 (L) 12.0 - 15.0 g/dL   HCT 34.5 (L) 36.0 - 46.0 %   MCV 91.5 80.0 - 100.0 fL   MCH 28.6 26.0 - 34.0 pg   MCHC 31.3 30.0 - 36.0 g/dL   RDW 18.0 (H) 11.5 - 15.5 %   Platelets 164 150 - 400 K/uL   nRBC 0.0 0.0 - 0.2 %    Comment: Performed at [facility], [address], [place], [place] [address]  Protime-INR     Status: None   Collection Time: 04/27/21  6:14 AM  Result Value Ref Range   Prothrombin Time 13.6 11.4 - 15.2 seconds   INR 1.0 0.8 - 1.2    Comment: (NOTE) INR goal varies based on device and disease states. Performed at [facility], [address], [place], [place] [address]   APTT     Status: None   Collection Time: 04/27/21  6:14 AM  Result Value Ref Range   aPTT 29 24 - 36 seconds    Comment: Performed at [facility], [address] [place],  [address]  Glucose, capillary     Status: Abnormal   Collection Time: 04/27/21 11:18 AM  Result Value Ref Range   Glucose-Capillary 166 (H) 70 - 99 mg/dL    Comment: Glucose reference range applies only to samples taken after fasting for at least 8 hours.  Glucose, capillary     Status: Abnormal   Collection Time: 04/27/21  4:14 PM  Result Value Ref Range   Glucose-Capillary 179 (H) 70 - 99 mg/dL    Comment: Glucose reference range applies only to samples taken after fasting for at least 8 hours.  Glucose, capillary     Status: Abnormal   Collection Time: 04/27/21  9:21 PM  Result Value Ref Range   Glucose-Capillary 148 (H) 70 - 99 mg/dL    Comment: Glucose reference range applies only to samples taken after fasting for at least 8 hours.   Comment 1 Notify RN    Comment 2 Document in Chart   Glucose, capillary     Status: Abnormal   Collection Time: 04/28/21  3:10 AM  Result Value Ref Range   Glucose-Capillary 128 (H) 70 - 99 mg/dL    Comment: Glucose reference range applies only to samples taken after fasting for at least 8 hours.  Glucose, capillary     Status: Abnormal   Collection Time:  04/28/21  7:46 AM  Result Value Ref Range   Glucose-Capillary 161 (H) 70 - 99 mg/dL    Comment: Glucose reference range applies only to samples taken after fasting for at least 8 hours.    Studies/Results: EEG ABNORMALITY - Excessive beta, generalized   IMPRESSION: This study is within normal limits. No seizures or epileptiform discharges were seen throughout the recording. The excessive beta activity seen in the background is most likely due to the effect of benzodiazepine and is a benign EEG pattern.   HEAD CT FINDINGS: Brain: There is  mild cerebral atrophy with widening of the extra-axial spaces and ventricular dilatation. There are areas of decreased attenuation within the white matter tracts of the supratentorial brain, consistent with microvascular disease changes.   Vascular: No hyperdense vessel or unexpected calcification.   Skull: Normal. Negative for fracture or focal lesion.   Sinuses/Orbits: No acute finding.   Other: None.   IMPRESSION: 1. Generalized cerebral atrophy. 2. No acute intracranial abnormality.     CAROTID  IMPRESSION: Minor carotid atherosclerosis. No hemodynamically significant ICA stenosis. Degree of narrowing less than 50% bilaterally by ultrasound criteria.      MRI BRAIN FINDINGS: Incomplete exam.  Axial FLAIR imaging was not performed.   Brain: No acute infarction, acute hemorrhage, hydrocephalus, extra-axial collection or mass lesion. Asymmetric susceptibility artifact in the basal ganglia, which may represent mineralization versus hemosiderin from prior hemorrhage. Mineralization is favored given faint hyperdensity on recent CT head exams. No T2 hypointensity or abnormal enhancement this region to suggest vascular malformation. No edema to suggest acute hemorrhage. No abnormal enhancement elsewhere. The hippocampi are symmetric in size/appearance and within normal limits. Mild scattered T2 hyperintensities in the white  matter. Tiny remote lacunar infarct in the right cerebellum.   Vascular: Major arterial flow voids are maintained at the skull base.   Skull and upper cervical spine: Normal marrow signal.   Sinuses/Orbits: Mild mucosal thickening.  Unremarkable orbits.   Other: No mastoid effusions.   IMPRESSION: 1. No evidence of acute intracranial abnormality on this incomplete exam (axial FLAIR imaging was not performed). The MRI scanner is currently down, but the patient will return to complete the exam when the MRI is functional and an addendum will be added to this report. 2. Asymmetric mineralization (favored) versus prior hemorrhage in the left basal ganglia. 3. Mild scattered T2 hyperintensities in the white matter, which are nonspecific but most likely related to chronic microvascular disease. Tiny remote lacunar infarct in the right cerebellum.    The MRI is reviewed in person. No acute changes are noted on DWI. No hemorrhages noted. There is increased white matter lesions seen on FLAIR involving the frontal lobes bilaterally slightly increased for age. Contrast given without abnormal enhancement. There is normal venous enhancement noted.       Korrie Hofbauer A. Merlene Laughter, M.D.  Diplomate, Tax adviser of Psychiatry and Neurology ( Neurology). 04/28/2021, 7:53 AM

## 2021-04-28 NOTE — Discharge Summary (Signed)
Physician Discharge Summary  Laurie Larson EPP:[id number] DOB: Jan 15, 1961 DOA: 04/26/2021  PCP: Aretta Nip, MD  Admit date: 04/26/2021  Discharge date: 04/28/2021  Admitted From:Home  Disposition:  Home  Recommendations for Outpatient Follow-up:  Follow up with PCP in 1-2 weeks Follow-up with Dr. Merlene Laughter outpatient in 1 month for further evaluation Continue on Depakote as prescribed Diazepam prescribed as needed for seizure activity  Home Health: None  Equipment/Devices: None  Discharge Condition:Stable  CODE STATUS: Full  Diet recommendation: Heart Healthy/carb modified  Brief/Interim Summary:  Laurie Larson is a 60 y.o. female with medical history significant for  T2DM on insulin pump, hypertension, GERD who presents to the emergency department due to seizure-like activity at home.  She had multiple repeat episodes and even had some episodes in the hospital, but appeared to be alert throughout the course of these spells.  Brain MRI without any acute findings and EEG with no findings of seizure activity.  She has been seen by neurology with concern for pseudoseizures.  She has been prescribed Depakote as well as diazepam as needed for any further activity.  No other acute events noted throughout the course of the stay and she is stable for discharge with plans to follow-up with neurology in 1 month.  Discharge Diagnoses:  Principal Problem:   Witnessed seizure-like activity ([place]) Active Problems:   Essential hypertension   Hyperglycemia due to diabetes mellitus ([place])   GERD (gastroesophageal reflux disease)  Principal discharge diagnosis: Witnessed seizure like activity with concern for pseudoseizures.  Discharge Instructions  Discharge Instructions     Diet - low sodium heart healthy   Complete by: As directed    Increase activity slowly   Complete by: As directed       Allergies as of 04/28/2021       Reactions   Acetaminophen  Anaphylaxis   Lisinopril Hives, Swelling        Medication List     STOP taking these medications    metFORMIN 500 MG tablet Commonly known as: Glucophage       TAKE these medications    amLODipine 2.5 MG tablet Commonly known as: NORVASC Take 2.5 mg by mouth daily.   diazepam 5 MG tablet Commonly known as: VALIUM Take 1 tablet (5 mg total) by mouth every 8 (eight) hours as needed (seizures).   divalproex 250 MG DR tablet Commonly known as: DEPAKOTE Take 1 tablet (250 mg total) by mouth every 8 (eight) hours.   losartan 100 MG tablet Commonly known as: Cozaar Take 1 tablet (100 mg total) by mouth daily.   multivitamin with minerals Tabs tablet Take 1 tablet by mouth daily.   NovoLOG 100 UNIT/ML injection Generic drug: insulin aspart Inject into the skin See admin instructions. Uses insuliin via pump.   omeprazole 40 MG capsule Commonly known as: PRILOSEC Take 1 capsule (40 mg total) by mouth in the morning and at bedtime.        Follow-up Information     Rankins, Bill Salinas, MD. Schedule an appointment as soon as possible for a visit in 1 week(s).   Specialty: Family Medicine Why: As needed Contact information: [place] [address] [phone]         Phillips Odor, MD. Schedule an appointment as soon as possible for a visit in 1 month(s).   Specialty: Neurology Contact information: [place] [address] [phone]  Allergies  Allergen Reactions   Acetaminophen Anaphylaxis   Lisinopril Hives and Swelling    Consultations: Neurology   Procedures/Studies: CT Head Wo Contrast  Result Date: 04/26/2021 CLINICAL DATA:  Recent seizure activity EXAM: CT HEAD WITHOUT CONTRAST TECHNIQUE: Contiguous axial images were obtained from the base of the skull through the vertex without intravenous contrast. COMPARISON:  01/21/2021 FINDINGS: Brain: No evidence of acute infarction, hemorrhage,  hydrocephalus, extra-axial collection or mass lesion/mass effect. Mild atrophic changes are again noted. Vascular: No hyperdense vessel or unexpected calcification. Skull: Normal. Negative for fracture or focal lesion. Sinuses/Orbits: No acute finding. Other: None. IMPRESSION: No acute intracranial abnormality. Electronically Signed   By: Inez Catalina M.D.   On: 04/26/2021 13:48   MR BRAIN W WO CONTRAST  Addendum Date: 04/27/2021   ADDENDUM REPORT: 04/27/2021 11:16 ADDENDUM: Axial FLAIR imaging was obtained, which confirms mild T2/FLAIR hyperintensities in the white matter (as discussed in the initial report). No new findings. Electronically Signed   By: Margaretha Sheffield MD   On: 04/27/2021 11:16   Result Date: 04/27/2021 CLINICAL DATA:  Neuro deficit, acute stroke suspected. EXAM: MRI HEAD WITHOUT AND WITH CONTRAST TECHNIQUE: Multiplanar, multiecho pulse sequences of the brain and surrounding structures were obtained without and with intravenous contrast. CONTRAST:  17mL GADAVIST GADOBUTROL 1 MMOL/ML IV SOLN COMPARISON:  CT head April 26, 2021. FINDINGS: Incomplete exam.  Axial FLAIR imaging was not performed. Brain: No acute infarction, acute hemorrhage, hydrocephalus, extra-axial collection or mass lesion. Asymmetric susceptibility artifact in the basal ganglia, which may represent mineralization versus hemosiderin from prior hemorrhage. Mineralization is favored given faint hyperdensity on recent CT head exams. No T2 hypointensity or abnormal enhancement this region to suggest vascular malformation. No edema to suggest acute hemorrhage. No abnormal enhancement elsewhere. The hippocampi are symmetric in size/appearance and within normal limits. Mild scattered T2 hyperintensities in the white matter. Tiny remote lacunar infarct in the right cerebellum. Vascular: Major arterial flow voids are maintained at the skull base. Skull and upper cervical spine: Normal marrow signal. Sinuses/Orbits: Mild mucosal  thickening.  Unremarkable orbits. Other: No mastoid effusions. IMPRESSION: 1. No evidence of acute intracranial abnormality on this incomplete exam (axial FLAIR imaging was not performed). The MRI scanner is currently down, but the patient will return to complete the exam when the MRI is functional and an addendum will be added to this report. 2. Asymmetric mineralization (favored) versus prior hemorrhage in the left basal ganglia. 3. Mild scattered T2 hyperintensities in the white matter, which are nonspecific but most likely related to chronic microvascular disease. Tiny remote lacunar infarct in the right cerebellum. Electronically Signed: By: Margaretha Sheffield MD On: 04/27/2021 08:59   EEG adult  Result Date: 04/27/2021 Lora Havens, MD     04/27/2021  3:47 PM Patient Name: Laurie Larson MRN: [id number] Epilepsy Attending: Lora Havens Referring Physician/Provider: Dr Nicanor Bake Date: 04/27/2021 Duration: 25.58 mins Patient history: 60yo F with seizure like activity. EEG to evaluate for seizure Level of alertness: Awake, asleep AEDs during EEG study: Ativan Technical aspects: This EEG study was done with scalp electrodes positioned according to the 10-20 International system of electrode placement. Electrical activity was acquired at a sampling rate of 500Hz  and reviewed with a high frequency filter of 70Hz  and a low frequency filter of 1Hz . EEG data were recorded continuously and digitally stored. Description: No posterior dominant rhythm was seen. Sleep was characterized by vertex waves, sleep spindles (12 to 14 Hz), maximal frontocentral region.  There  is an excessive amount of 15 to 18 Hz beta activity distributed symmetrically and diffusely. Hyperventilation did not show any EEG change.  Physiologic photic driving was not seen during photic stimulation.  ABNORMALITY - Excessive beta, generalized IMPRESSION: This study is within normal limits. No seizures or epileptiform discharges  were seen throughout the recording. The excessive beta activity seen in the background is most likely due to the effect of benzodiazepine and is a benign EEG pattern. Lora Havens     Discharge Exam: Vitals:   04/27/21 2118 04/28/21 0525  BP: (!) 159/89 (!) 164/73  Pulse: (!) 59 70  Resp: 20 16  Temp: 98.6 F (37 C) 98.7 F (37.1 C)  SpO2: 98% 98%   Vitals:   04/27/21 0426 04/27/21 0853 04/27/21 2118 04/28/21 0525  BP: (!) 151/92 (!) 156/91 (!) 159/89 (!) 164/73  Pulse: 68 62 (!) 59 70  Resp: 14 16 20 16   Temp: 98 F (36.7 C) 98.1 F (36.7 C) 98.6 F (37 C) 98.7 F (37.1 C)  TempSrc: Oral Oral Oral Oral  SpO2: 99% 100% 98% 98%  Weight:      Height:        General: Pt is alert, awake, not in acute distress Cardiovascular: RRR, S1/S2 +, no rubs, no gallops Respiratory: CTA bilaterally, no wheezing, no rhonchi Abdominal: Soft, NT, ND, bowel sounds + Extremities: no edema, no cyanosis    The results of significant diagnostics from this hospitalization (including imaging, microbiology, ancillary and laboratory) are listed below for reference.     Microbiology: Recent Results (from the past 240 hour(s))  Resp Panel by RT-PCR (Flu A&B, Covid) Nasopharyngeal Swab     Status: None   Collection Time: 04/26/21  9:02 PM   Specimen: Nasopharyngeal Swab; Nasopharyngeal(NP) swabs in vial transport medium  Result Value Ref Range Status   SARS Coronavirus 2 by RT PCR NEGATIVE NEGATIVE Final    Comment: (NOTE) SARS-CoV-2 target nucleic acids are NOT DETECTED.  The SARS-CoV-2 RNA is generally detectable in upper respiratory specimens during the acute phase of infection. The lowest concentration of SARS-CoV-2 viral copies this assay can detect is 138 copies/mL. A negative result does not preclude SARS-Cov-2 infection and should not be used as the sole basis for treatment or other patient management decisions. A negative result may occur with  improper specimen  collection/handling, submission of specimen other than nasopharyngeal swab, presence of viral mutation(s) within the areas targeted by this assay, and inadequate number of viral copies(<138 copies/mL). A negative result must be combined with clinical observations, patient history, and epidemiological information. The expected result is Negative.  Fact Sheet for Patients:  EntrepreneurPulse.com.au  Fact Sheet for Healthcare Providers:  IncredibleEmployment.be  This test is no t yet approved or cleared by the [place] FDA and  has been authorized for detection and/or diagnosis of SARS-CoV-2 by FDA under an Emergency Use Authorization (EUA). This EUA will remain  in effect (meaning this test can be used) for the duration of the COVID-19 declaration under Section 564(b)(1) of the Act, 21 U.S.C.section 360bbb-3(b)(1), unless the authorization is terminated  or revoked sooner.       Influenza A by PCR NEGATIVE NEGATIVE Final   Influenza B by PCR NEGATIVE NEGATIVE Final    Comment: (NOTE) The Xpert Xpress SARS-CoV-2/FLU/RSV plus assay is intended as an aid in the diagnosis of influenza from Nasopharyngeal swab specimens and should not be used as a sole basis for treatment. Nasal washings and aspirates are unacceptable  for Xpert Xpress SARS-CoV-2/FLU/RSV testing.  Fact Sheet for Patients: EntrepreneurPulse.com.au  Fact Sheet for Healthcare Providers: IncredibleEmployment.be  This test is not yet approved or cleared by the [place] FDA and has been authorized for detection and/or diagnosis of SARS-CoV-2 by FDA under an Emergency Use Authorization (EUA). This EUA will remain in effect (meaning this test can be used) for the duration of the COVID-19 declaration under Section 564(b)(1) of the Act, 21 U.S.C. section 360bbb-3(b)(1), unless the authorization is terminated or revoked.  Performed at [facility], [address], [place], [place] [address]      Labs: BNP (last 3 results) No results for input(s): BNP in the last 8760 hours. Basic Metabolic Panel: Recent Labs  Lab 04/26/21 1304 04/27/21 0614  NA 135 137  K 4.0 3.5  CL 105 108  CO2 19* 23  GLUCOSE 286* 77  BUN 10 8  CREATININE 0.62 0.62  CALCIUM 8.7* 8.7*   Liver Function Tests: Recent Labs  Lab 04/27/21 0614  AST 22  ALT 17  ALKPHOS 79  BILITOT 0.5  PROT 6.1*  ALBUMIN 3.3*   No results for input(s): LIPASE, AMYLASE in the last 168 hours. No results for input(s): AMMONIA in the last 168 hours. CBC: Recent Labs  Lab 04/26/21 1304 04/27/21 0614  WBC 5.8 5.3  NEUTROABS 4.2  --   HGB 11.6* 10.8*  HCT 35.4* 34.5*  MCV 90.1 91.5  PLT 177 164   Cardiac Enzymes: No results for input(s): CKTOTAL, CKMB, CKMBINDEX, TROPONINI in the last 168 hours. BNP: Invalid input(s): POCBNP CBG: Recent Labs  Lab 04/27/21 1614 04/27/21 2121 04/28/21 0310 04/28/21 0746 04/28/21 1023  GLUCAP 179* 148* 128* 161* 196*   D-Dimer No results for input(s): DDIMER in the last 72 hours. Hgb A1c No results for input(s): HGBA1C in the last 72 hours. Lipid Profile No results for input(s): CHOL, HDL, LDLCALC, TRIG, CHOLHDL, LDLDIRECT in the last 72 hours. Thyroid function studies No results for input(s): TSH, T4TOTAL, T3FREE, THYROIDAB in the last 72 hours.  Invalid input(s): FREET3 Anemia work up No results for input(s): VITAMINB12, FOLATE, FERRITIN, TIBC, IRON, RETICCTPCT in the last 72 hours. Urinalysis    Component Value Date/Time   COLORURINE STRAW (A) 04/26/2021 2102   APPEARANCEUR CLEAR 04/26/2021 2102   LABSPEC 1.017 04/26/2021 2102   PHURINE 5.0 04/26/2021 2102   GLUCOSEU >=500 (A) 04/26/2021 2102   HGBUR NEGATIVE 04/26/2021 2102   BILIRUBINUR NEGATIVE 04/26/2021 2102   KETONESUR 80 (A) 04/26/2021 2102   PROTEINUR NEGATIVE 04/26/2021 2102   NITRITE NEGATIVE 04/26/2021 2102   LEUKOCYTESUR NEGATIVE  04/26/2021 2102   Sepsis Labs Invalid input(s): PROCALCITONIN,  WBC,  LACTICIDVEN Microbiology Recent Results (from the past 240 hour(s))  Resp Panel by RT-PCR (Flu A&B, Covid) Nasopharyngeal Swab     Status: None   Collection Time: 04/26/21  9:02 PM   Specimen: Nasopharyngeal Swab; Nasopharyngeal(NP) swabs in vial transport medium  Result Value Ref Range Status   SARS Coronavirus 2 by RT PCR NEGATIVE NEGATIVE Final    Comment: (NOTE) SARS-CoV-2 target nucleic acids are NOT DETECTED.  The SARS-CoV-2 RNA is generally detectable in upper respiratory specimens during the acute phase of infection. The lowest concentration of SARS-CoV-2 viral copies this assay can detect is 138 copies/mL. A negative result does not preclude SARS-Cov-2 infection and should not be used as the sole basis for treatment or other patient management decisions. A negative result may occur with  improper specimen collection/handling, submission of specimen  other than nasopharyngeal swab, presence of viral mutation(s) within the areas targeted by this assay, and inadequate number of viral copies(<138 copies/mL). A negative result must be combined with clinical observations, patient history, and epidemiological information. The expected result is Negative.  Fact Sheet for Patients:  EntrepreneurPulse.com.au  Fact Sheet for Healthcare Providers:  IncredibleEmployment.be  This test is no t yet approved or cleared by the [place] FDA and  has been authorized for detection and/or diagnosis of SARS-CoV-2 by FDA under an Emergency Use Authorization (EUA). This EUA will remain  in effect (meaning this test can be used) for the duration of the COVID-19 declaration under Section 564(b)(1) of the Act, 21 U.S.C.section 360bbb-3(b)(1), unless the authorization is terminated  or revoked sooner.       Influenza A by PCR NEGATIVE NEGATIVE Final   Influenza B by PCR NEGATIVE  NEGATIVE Final    Comment: (NOTE) The Xpert Xpress SARS-CoV-2/FLU/RSV plus assay is intended as an aid in the diagnosis of influenza from Nasopharyngeal swab specimens and should not be used as a sole basis for treatment. Nasal washings and aspirates are unacceptable for Xpert Xpress SARS-CoV-2/FLU/RSV testing.  Fact Sheet for Patients: EntrepreneurPulse.com.au  Fact Sheet for Healthcare Providers: IncredibleEmployment.be  This test is not yet approved or cleared by the [place] FDA and has been authorized for detection and/or diagnosis of SARS-CoV-2 by FDA under an Emergency Use Authorization (EUA). This EUA will remain in effect (meaning this test can be used) for the duration of the COVID-19 declaration under Section 564(b)(1) of the Act, 21 U.S.C. section 360bbb-3(b)(1), unless the authorization is terminated or revoked.  Performed at [facility], [address], [place], [place] [address]      Time coordinating discharge: 35 minutes  SIGNED:   Rodena Goldmann, DO [facility] 04/28/2021, 11:44 AM  If 7PM-7AM, please contact night-coverage [email or web address]

## 2022-01-16 ENCOUNTER — Other Ambulatory Visit: Payer: Self-pay

## 2022-01-16 ENCOUNTER — Ambulatory Visit
Admission: EM | Admit: 2022-01-16 | Discharge: 2022-01-16 | Disposition: A | Discharge status: Home / Self Care | Payer: 59 | Attending: Family Medicine | Admitting: Family Medicine

## 2022-01-16 ENCOUNTER — Encounter: Payer: Self-pay | Admitting: Emergency Medicine

## 2022-01-16 DIAGNOSIS — J029 Acute pharyngitis, unspecified: Secondary | ICD-10-CM | POA: Diagnosis not present

## 2022-01-16 DIAGNOSIS — Z1152 Encounter for screening for COVID-19: Secondary | ICD-10-CM | POA: Diagnosis not present

## 2022-01-16 DIAGNOSIS — J069 Acute upper respiratory infection, unspecified: Secondary | ICD-10-CM | POA: Insufficient documentation

## 2022-01-16 LAB — POCT RAPID STREP A (OFFICE): Rapid Strep A Screen: NEGATIVE

## 2022-01-16 NOTE — ED Provider Notes (Signed)
?[facility] ? ? ? ?CSN: [id number] ?Arrival date & time: 01/16/22  1647 ? ? ?  ? ?History   ?Chief Complaint ?Chief Complaint  ?Patient presents with  ? Sore Throat  ? ? ?HPI ?Laurie Larson is a 61 y.o. female.  ? ?Presenting today with 4 to 5-day history of sore throat, cough, fatigue, mild body aches and chills, cough.  Denies chest pain, shortness of breath, abdominal pain, nausea vomiting or diarrhea.  So far trying diabetic test and and cough drops with mild temporary relief.  Husband sick with similar symptoms last week.  No known history of chronic pulmonary disease, does have a history of type 1 diabetes on insulin pump. ? ? ?Past Medical History:  ?Diagnosis Date  ? Benign tumor of breast   ? removed  ? Depressive episode   ? rersolved  ? Diabetes mellitus without complication ([place])   ? Diarrhea   ? GI Outlaw  ? GERD (gastroesophageal reflux disease) 04/27/2021  ? History of stomach ulcers   ? Hypertension   ? Rectal bleeding   ? Symptomatic anemia 01/22/2021  ? ? ?Patient Active Problem List  ? Diagnosis Date Noted  ? GERD (gastroesophageal reflux disease) 04/27/2021  ? Witnessed seizure-like activity ([place]) 04/26/2021  ? Duodenal ulcer   ? GI bleed 01/22/2021  ? Syncope and collapse 01/22/2021  ? Nausea & vomiting 01/22/2021  ? Diarrhea 01/22/2021  ? Symptomatic anemia 01/22/2021  ? Macrocytic anemia 01/22/2021  ? Hyperglycemia due to diabetes mellitus ([place]) 01/22/2021  ? COVID-19 virus infection 01/22/2021  ? Hypoalbuminemia 01/22/2021  ? Orthostatic hypotension 01/22/2021  ? Dehydration 01/22/2021  ? Essential hypertension 12/02/2017  ? Hypokalemia 12/02/2017  ? Tobacco abuse counseling 12/02/2017  ? DKA, type 2 ([place]) 12/02/2017  ? DKA (diabetic ketoacidoses) 12/01/2017  ? 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 ? ?Past Surgical History:  ?Procedure Laterality Date  ? ABDOMINAL HYSTERECTOMY    ? Total  ? AUGMENTATION MAMMAPLASTY    ? BREAST ENHANCEMENT SURGERY    ? BREAST EXCISIONAL  BIOPSY    ? BREAST SURGERY    ? benign right breast  ? COLONOSCOPY  12/2012  ? polyps benign, diverticulosis, hemorrhoids, recheck in 3 years  ? ESOPHAGOGASTRODUODENOSCOPY    ? normal esophagus,gastritis, duodenal erosions without bleeding, bx benign  ? ESOPHAGOGASTRODUODENOSCOPY (EGD) WITH PROPOFOL N/A 01/22/2021  ? Procedure: ESOPHAGOGASTRODUODENOSCOPY (EGD) WITH PROPOFOL;  Surgeon: Harvel Quale, MD;  [facility]: [facility];  Service: Gastroenterology;  Laterality: N/A;  ? ?OB History   ?No obstetric history on file. ?  ? ?Home Medications   ? ?Prior to Admission medications   ?Medication Sig Start Date End Date Taking? Authorizing Provider  ?amLODipine (NORVASC) 2.5 MG tablet Take 2.5 mg by mouth daily. 10/18/20   [provider]  ?diazepam (VALIUM) 5 MG tablet Take 1 tablet (5 mg total) by mouth every 8 (eight) hours as needed (seizures). 04/28/21   Manuella Ghazi, Pratik D, DO  ?divalproex (DEPAKOTE) 250 MG DR tablet Take 1 tablet (250 mg total) by mouth every 8 (eight) hours. 04/28/21 05/28/21  Manuella Ghazi, Pratik D, DO  ?losartan (COZAAR) 100 MG tablet Take 1 tablet (100 mg total) by mouth daily. 12/02/17 04/26/21  Dhungel, Flonnie Overman, MD  ?Multiple Vitamin (MULTIVITAMIN WITH MINERALS) TABS tablet Take 1 tablet by mouth daily.    [provider]  ?NOVOLOG 100 UNIT/ML injection Inject into the skin See admin instructions. Uses insuliin via pump. 11/21/20   [provider]  ?omeprazole (PRILOSEC) 40  MG capsule Take 1 capsule (40 mg total) by mouth in the morning and at bedtime. 01/25/21 04/25/21  Murlean Iba, MD  ? ?Family History ?Family History  ?Problem Relation Age of Onset  ? Ovarian cancer Maternal Aunt   ?     deceased  ? Juvenile Diabetes Maternal Aunt   ? Breast cancer Maternal Aunt   ? Cancer Maternal Grandmother   ?     type unknown  ? Breast cancer Sister   ? Breast cancer Mother   ?     deceased  ? ?Social History ?Social History  ? ?Tobacco Use  ? Smoking status: Former  ?   Packs/day: 0.50  ?  Years: 15.00  ?  Pack years: 7.50  ?  Types: Cigarettes  ?  Quit date: 08/02/2020  ?  Years since quitting: 1.4  ? Smokeless tobacco: Never  ?Vaping Use  ? Vaping Use: Never used  ?Substance Use Topics  ? Alcohol use: Yes  ?  Alcohol/week: 5.0 standard drinks  ?  Types: 5 Glasses of wine per week  ?  Comment: moderate  ? Drug use: No  ? ? ? ?Allergies   ?Acetaminophen and Lisinopril ? ? ?Review of Systems ?Review of Systems ?Per HPI ? ?Physical Exam ?Triage Vital Signs ?ED Triage Vitals  ?Enc Vitals Group  ?   BP 01/16/22 1728 (!) 155/119  ?   Pulse Rate 01/16/22 1728 92  ?   Resp 01/16/22 1728 18  ?   Temp 01/16/22 1728 98.4 ?F (36.9 ?C)  ?   Temp Source 01/16/22 1728 Oral  ?   SpO2 01/16/22 1728 98 %  ?   Weight 01/16/22 1729 124 lb (56.2 kg)  ?   Height 01/16/22 1729 '5\' 5"'$  (1.651 m)  ?   Head Circumference --   ?   Peak Flow --   ?   Pain Score 01/16/22 1729 0  ?   Pain Loc --   ?   Pain Edu? --   ?   Excl. in [place]? --   ? ?No data found. ? ?Updated Vital Signs ?BP (!) 155/119 (BP Location: Right Arm) Comment: pt reports has high bp but took medication late today.  Pulse 92   Temp 98.4 ?F (36.9 ?C) (Oral)   Resp 18   Ht '5\' 5"'$  (1.651 m)   Wt 124 lb (56.2 kg)   SpO2 98%   BMI 20.63 kg/m?  ? ?Visual Acuity ?Right Eye Distance:   ?Left Eye Distance:   ?Bilateral Distance:   ? ?Right Eye Near:   ?Left Eye Near:    ?Bilateral Near:    ? ?Physical Exam ?Vitals and nursing note reviewed.  ?Constitutional:   ?   Appearance: Normal appearance. She is not ill-appearing.  ?HENT:  ?   Head: Atraumatic.  ?   Right Ear: Tympanic membrane and external ear normal.  ?   Left Ear: Tympanic membrane and external ear normal.  ?   Nose: Rhinorrhea present.  ?   Mouth/Throat:  ?   Mouth: Mucous membranes are moist.  ?   Pharynx: Posterior oropharyngeal erythema present.  ?Eyes:  ?   Extraocular Movements: Extraocular movements intact.  ?   Conjunctiva/sclera: Conjunctivae normal.  ?Cardiovascular:  ?   Rate and  Rhythm: Normal rate and regular rhythm.  ?   Heart sounds: Normal heart sounds.  ?Pulmonary:  ?   Effort: Pulmonary effort is normal.  ?   Breath sounds: Normal  breath sounds. No wheezing or rales.  ?Musculoskeletal:     ?   General: Normal range of motion.  ?   Cervical back: Normal range of motion and neck supple.  ?Skin: ?   General: Skin is warm and dry.  ?Neurological:  ?   Mental Status: She is alert and oriented to person, place, and time.  ?Psychiatric:     ?   Mood and Affect: Mood normal.     ?   Thought Content: Thought content normal.     ?   Judgment: Judgment normal.  ? ? ? ?UC Treatments / Results  ?Labs ?(all labs ordered are listed, but only abnormal results are displayed) ?Labs Reviewed  ?COVID-19, FLU A+B NAA  ?CULTURE, GROUP A STREP [facility])  ?POCT RAPID STREP A (OFFICE)  ? ? ?EKG ? ? ?Radiology ?No results found. ? ?Procedures ?Procedures (including critical care time) ? ?Medications Ordered in UC ?Medications - No data to display ? ?Initial Impression / Assessment and Plan / UC Course  ?I have reviewed the triage vital signs and the nursing notes. ? ?Pertinent labs & imaging results that were available during my care of the patient were reviewed by me and considered in my medical decision making (see chart for details). ? ?  ? ?Vitals reassuring, rapid strep negative, throat culture and COVID and flu testing pending.  We will treat symptomatically with Flonase, diabetic testing as that is helping her, supportive over-the-counter medications and home care additionally.  Return for acutely worsening symptoms. ? ?Final Clinical Impressions(s) / UC Diagnoses  ? ?Final diagnoses:  ?Encounter for screening for COVID-19  ?Sore throat  ?Viral URI with cough  ? ?Discharge Instructions   ?None ?  ? ?ED Prescriptions   ?None ?  ? ?PDMP not reviewed this encounter. ?  ?Volney American, PA-C ?01/16/22 1829 ? ?

## 2022-01-16 NOTE — ED Triage Notes (Signed)
Pt reports sore throat, cough, fatigue. Pt reports husband tested positive for strep last week.  ? ?Pt also inquiring about covid/flu swab.  ?

## 2022-01-17 LAB — COVID-19, FLU A+B NAA
Influenza A, NAA: NOT DETECTED
Influenza B, NAA: NOT DETECTED
SARS-CoV-2, NAA: NOT DETECTED

## 2022-01-19 LAB — CULTURE, GROUP A STREP (THRC)

## 2022-02-09 ENCOUNTER — Other Ambulatory Visit: Payer: Self-pay | Admitting: Family Medicine

## 2022-02-09 DIAGNOSIS — Z1231 Encounter for screening mammogram for malignant neoplasm of breast: Secondary | ICD-10-CM

## 2022-02-20 ENCOUNTER — Other Ambulatory Visit: Payer: Self-pay | Admitting: Orthopedic Surgery

## 2022-02-20 DIAGNOSIS — M259 Joint disorder, unspecified: Secondary | ICD-10-CM

## 2022-02-22 ENCOUNTER — Ambulatory Visit
Admission: RE | Admit: 2022-02-22 | Discharge: 2022-02-22 | Disposition: A | Discharge status: Home / Self Care | Payer: 59 | Source: Ambulatory Visit | Attending: Family Medicine | Admitting: Family Medicine

## 2022-02-22 DIAGNOSIS — Z1231 Encounter for screening mammogram for malignant neoplasm of breast: Secondary | ICD-10-CM

## 2022-03-01 ENCOUNTER — Ambulatory Visit
Admission: RE | Admit: 2022-03-01 | Discharge: 2022-03-01 | Disposition: A | Discharge status: Home / Self Care | Payer: 59 | Source: Ambulatory Visit | Attending: Orthopedic Surgery | Admitting: Orthopedic Surgery

## 2022-03-01 DIAGNOSIS — M259 Joint disorder, unspecified: Secondary | ICD-10-CM

## 2023-05-22 ENCOUNTER — Other Ambulatory Visit: Payer: Self-pay | Admitting: Family Medicine

## 2023-05-22 DIAGNOSIS — Z1231 Encounter for screening mammogram for malignant neoplasm of breast: Secondary | ICD-10-CM

## 2023-06-05 ENCOUNTER — Ambulatory Visit
Admission: RE | Admit: 2023-06-05 | Discharge: 2023-06-05 | Disposition: A | Discharge status: Home / Self Care | Payer: 59 | Source: Ambulatory Visit | Attending: Family Medicine | Admitting: Family Medicine

## 2023-06-05 DIAGNOSIS — Z1231 Encounter for screening mammogram for malignant neoplasm of breast: Secondary | ICD-10-CM

## 2023-09-15 IMAGING — MG DIGITAL SCREENING BREAST BILAT IMPLANT W/ TOMO W/ CAD
9 of 12 series · 9 of 28 positions shown · non-contrast
Comparison: Previous exam(s).

CLINICAL DATA: Screening.

EXAM:
DIGITAL SCREENING BILATERAL MAMMOGRAM WITH IMPLANTS, CAD AND
TOMOSYNTHESIS
TECHNIQUE: Bilateral screening digital craniocaudal and mediolateral oblique
mammograms were obtained. Bilateral screening digital breast
tomosynthesis was performed. The images were evaluated with
computer-aided detection. Standard and/or implant displaced views
were performed.

[R CC]
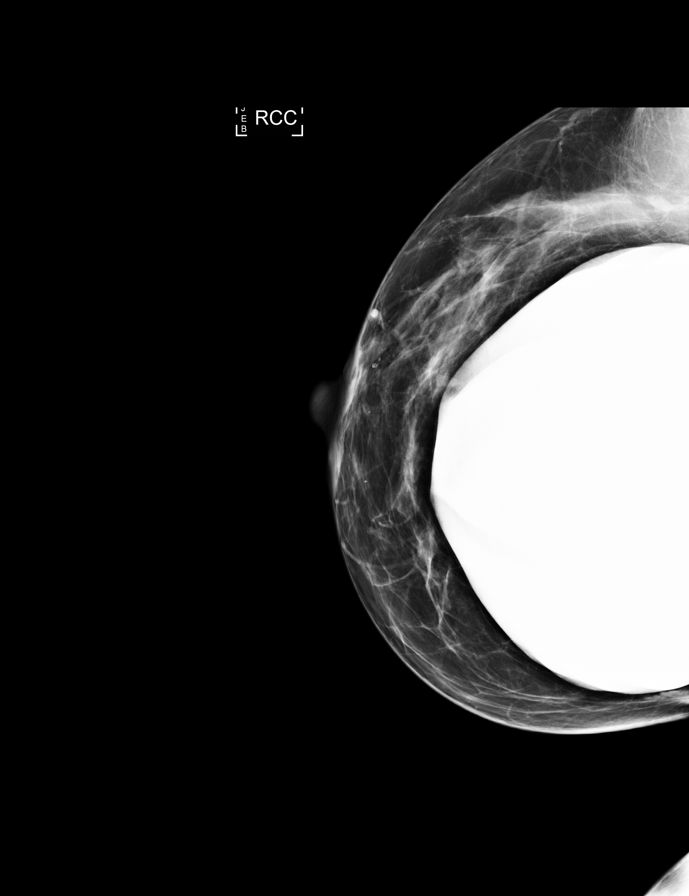

[R MLO]
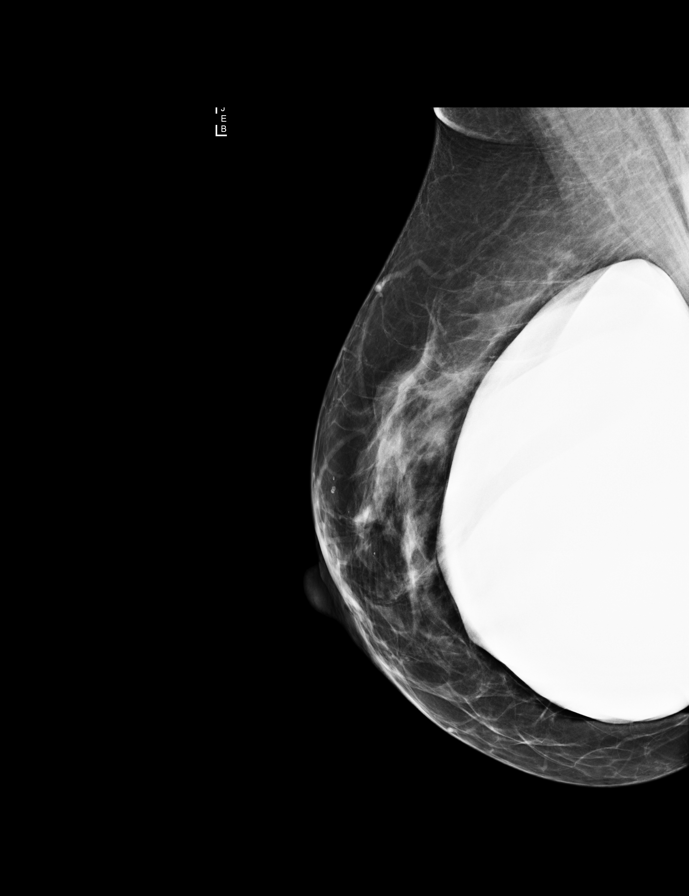

[L CC]
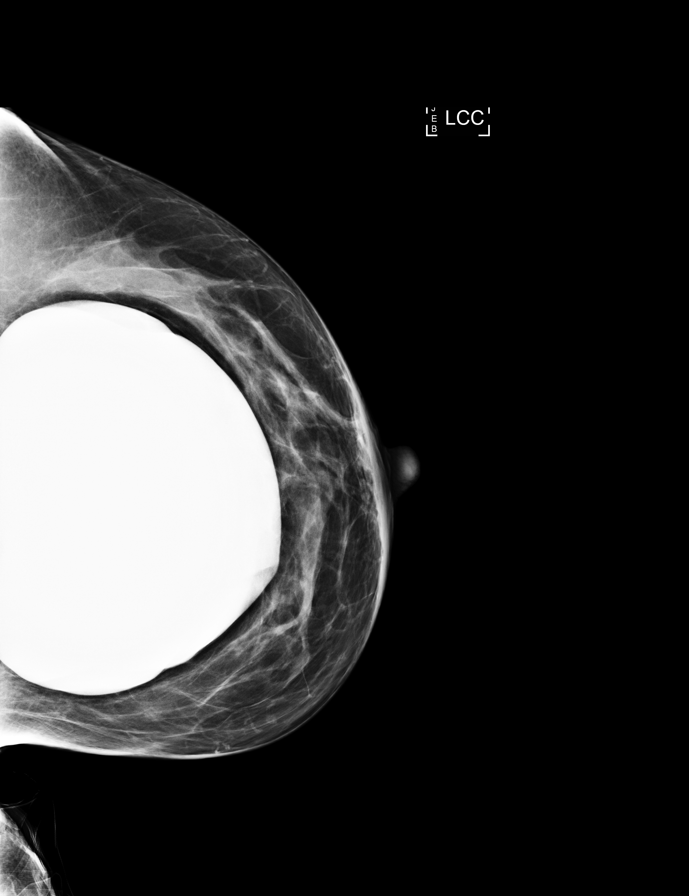

[L MLO]
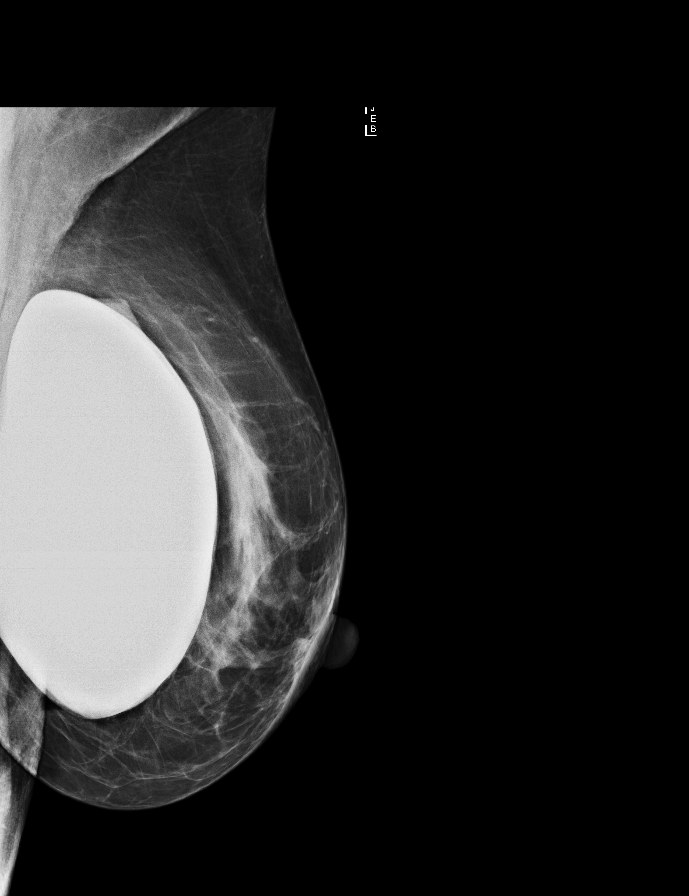

[L MLO synth-2D]
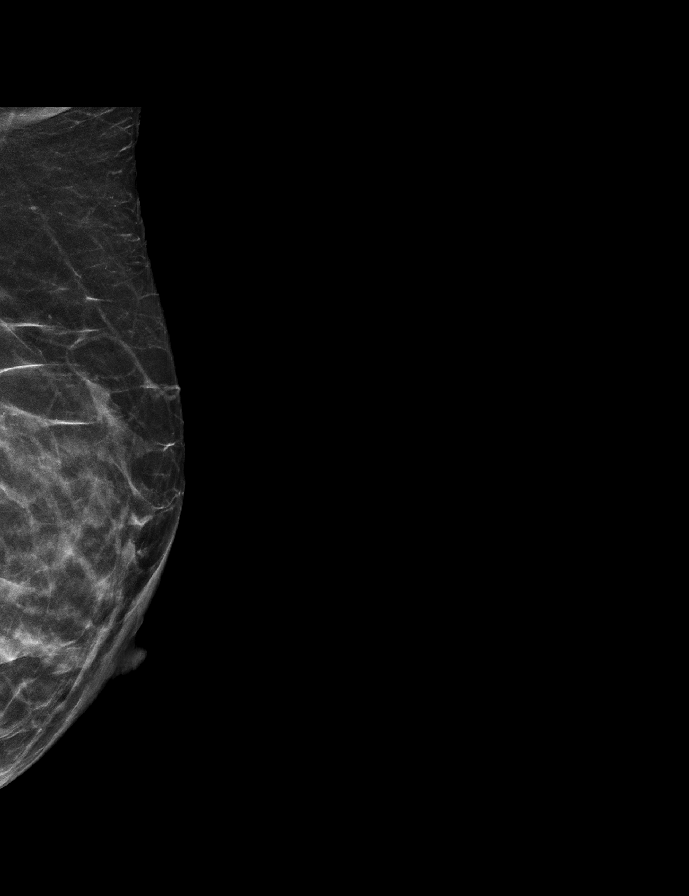

[R CC synth-2D]
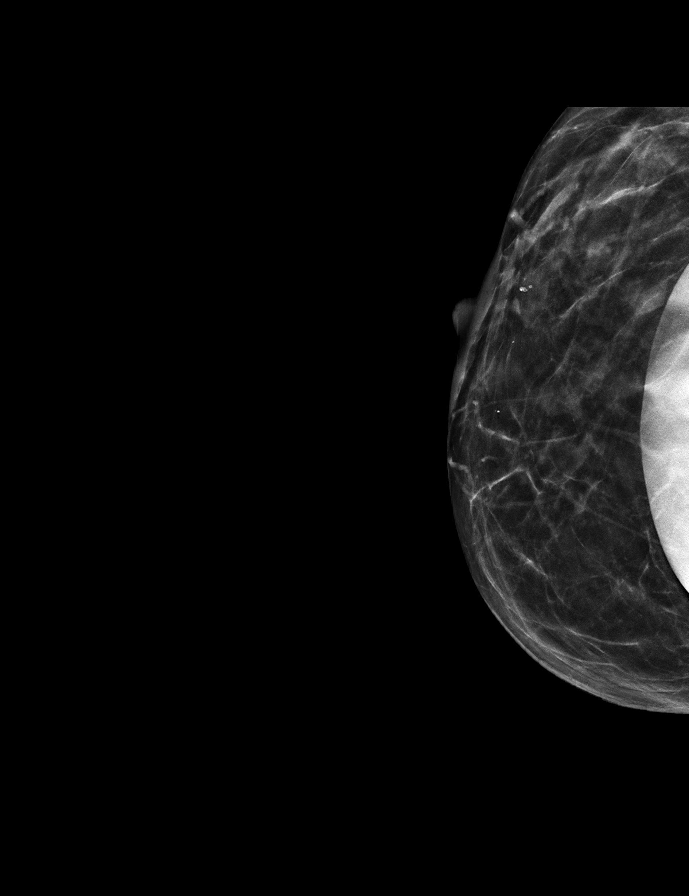

[R MLO synth-2D]
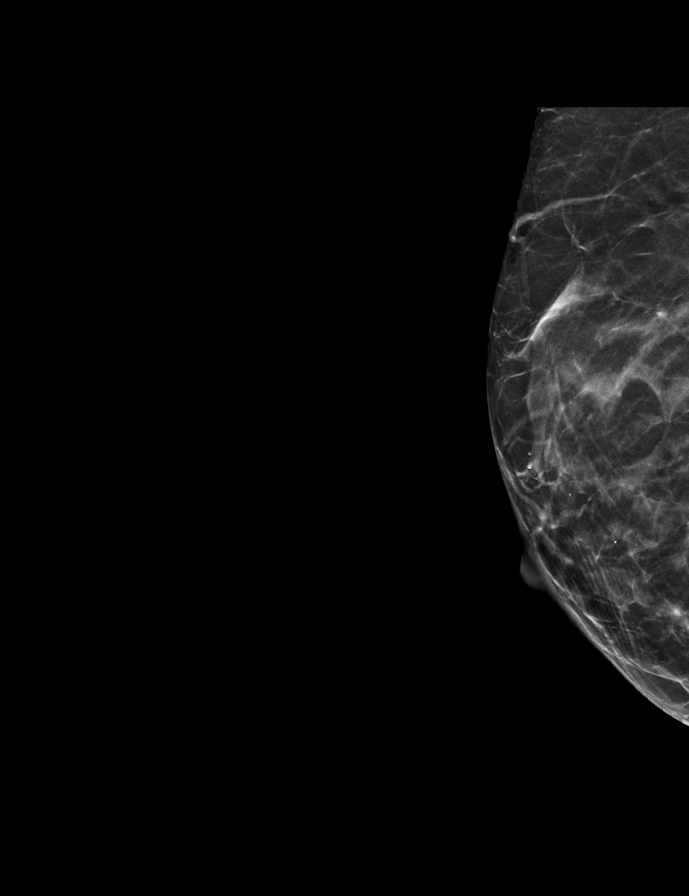

[L CC synth-2D]
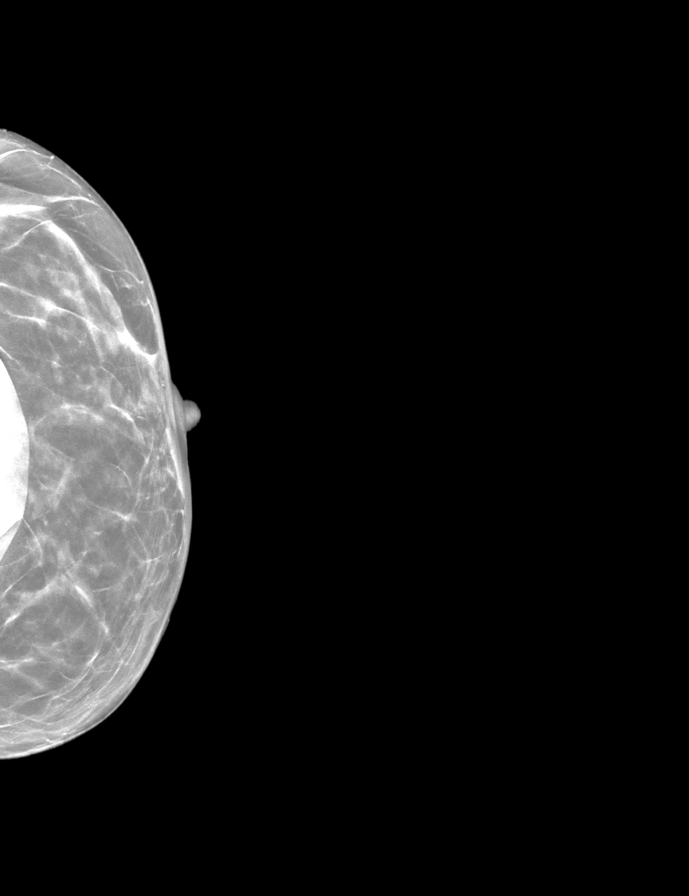

[L MLOID BREAST TOMOSYNTHESIS IMAGE tomo · tomo slice 25/48.0]
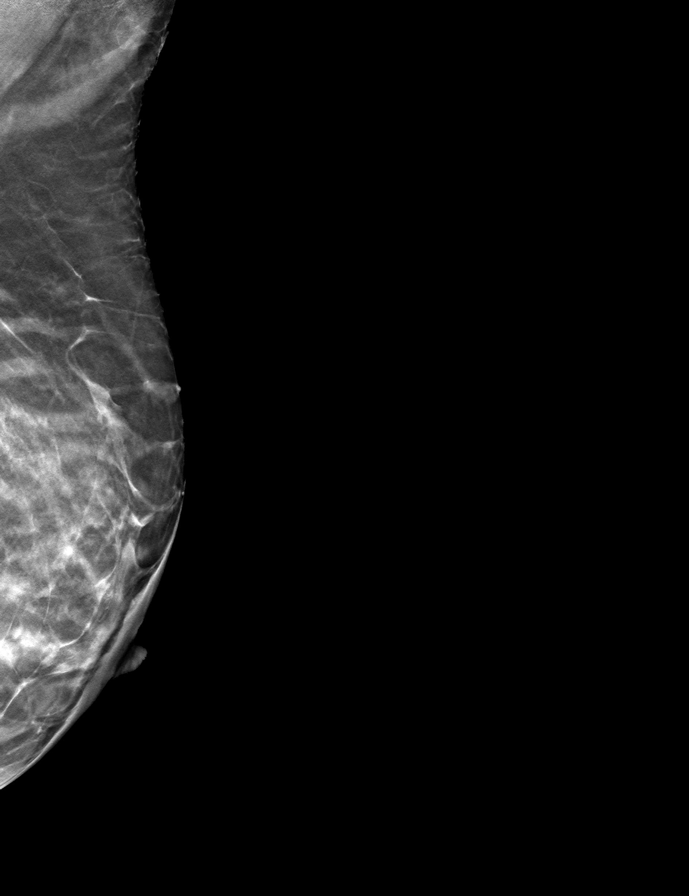

[9 of 28 positions shown; findings below may reference images not displayed]

ACR Breast Density Category c: The breast tissue is heterogeneously
dense, which may obscure small masses.
FINDINGS: The patient has retroglandular implants. There are no findings
suspicious for malignancy.
IMPRESSION: No mammographic evidence of malignancy. A result letter of this
screening mammogram will be mailed directly to the patient.

RECOMMENDATION:
Screening mammogram in one year. (Code:JF-9-JC8)

BI-RADS CATEGORY  1:  Negative.

## 2023-09-22 IMAGING — CT CT BIOPSY
1 of 2 series · 14 of 32 positions shown, 19 images · non-contrast
Comparison: none

CLINICAL DATA: Joint disorder. Right posterior hip pain. Abnormal
walking

[Series 2: needle -guided injection · axial · 0.71mm/px · z∈[+926,+1006]mm · 14 of 46 slices shown, 19 images]
[im 3/46  soft-tissue]
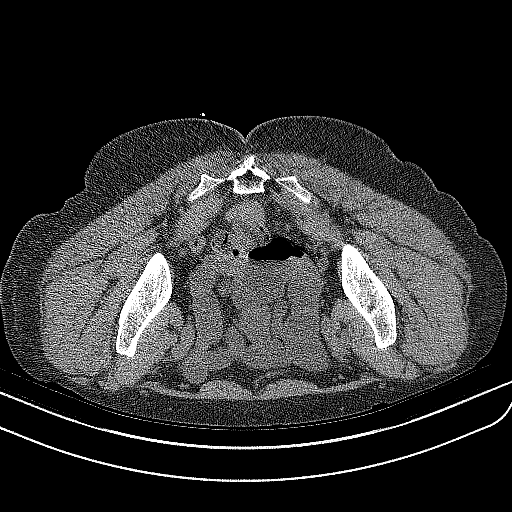
[im 3/46  bone]
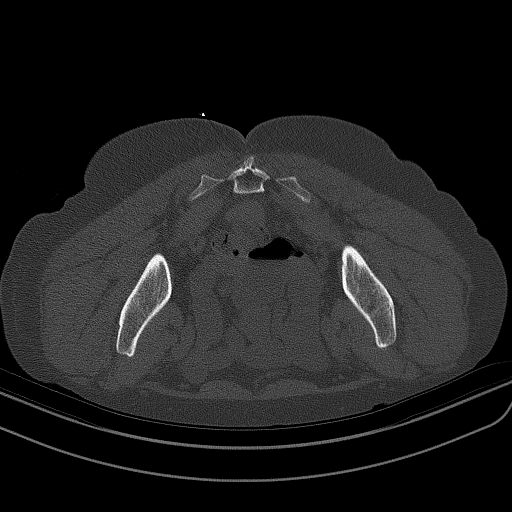
[im 7/46  soft-tissue]
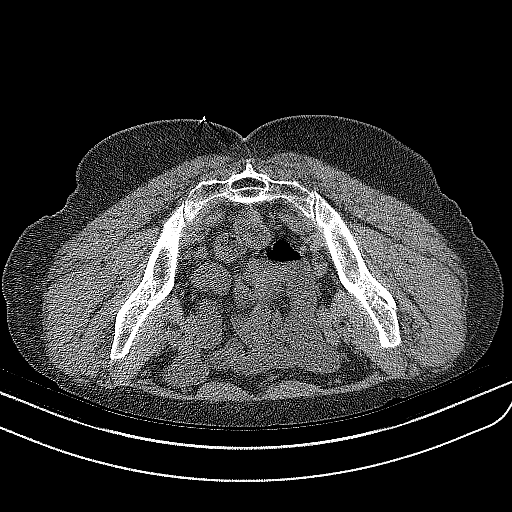
[im 10/46  soft-tissue]
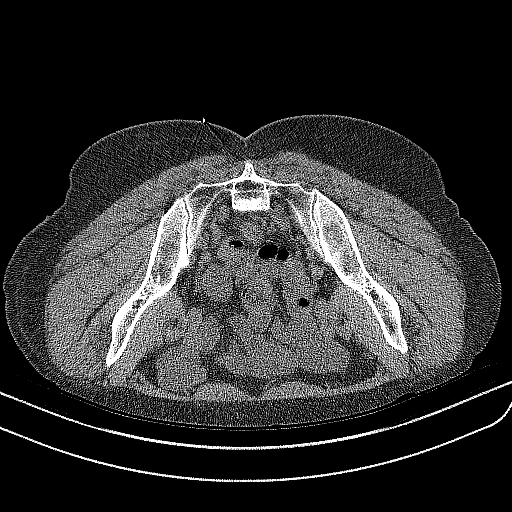
[im 14/46  soft-tissue]
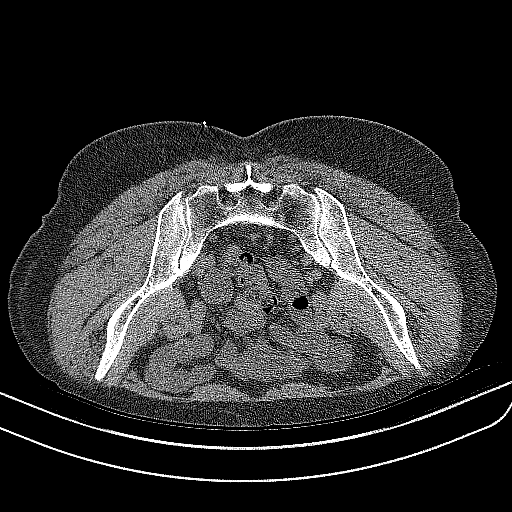
[im 16/46  soft-tissue]
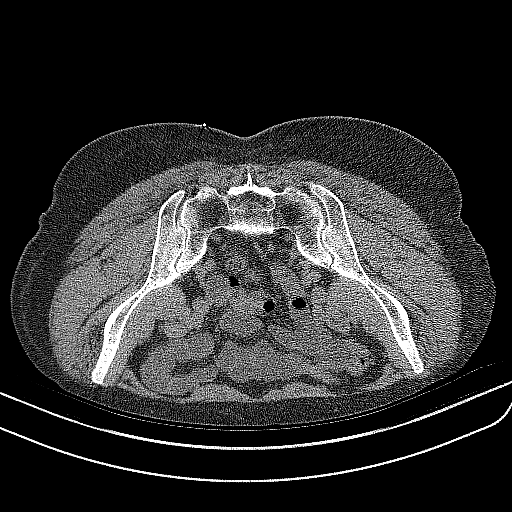
[im 21/46  soft-tissue]
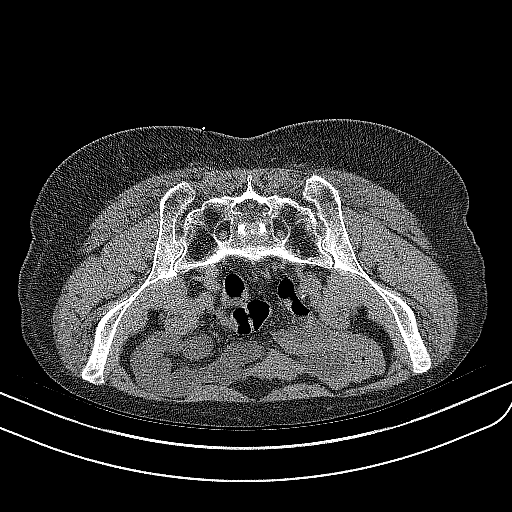
[im 23/46  soft-tissue]
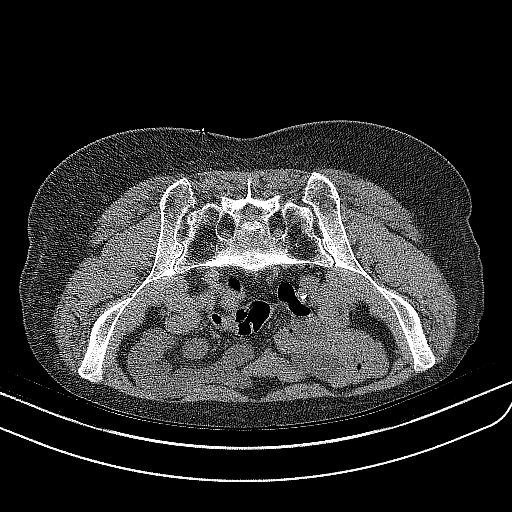
[im 25/46  soft-tissue]
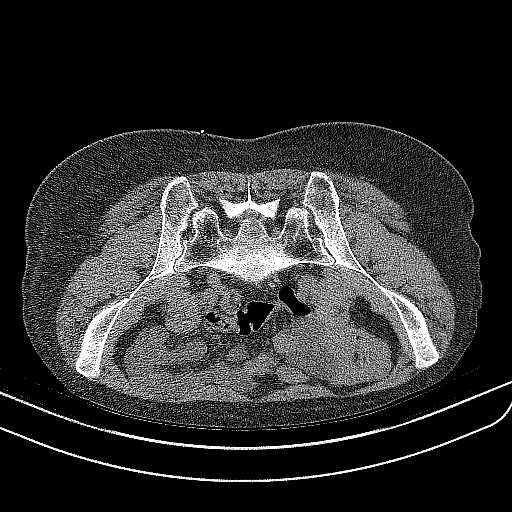
[im 30/46  soft-tissue]
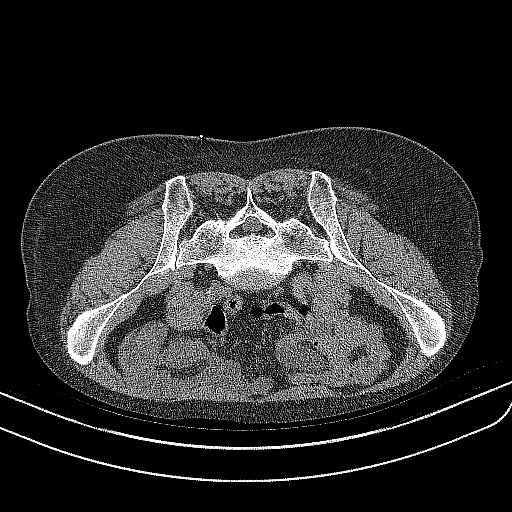
[im 30/46  bone]
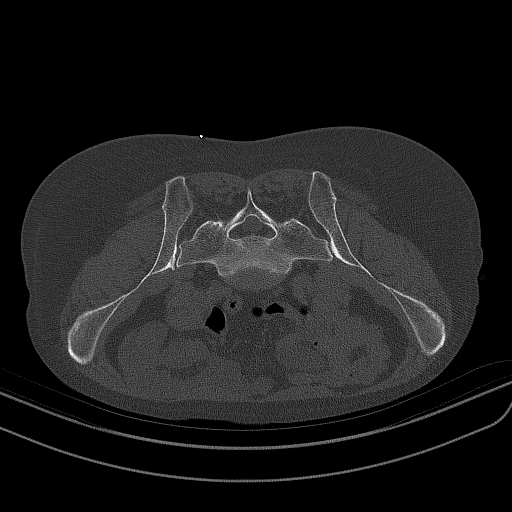
[im 32/46  soft-tissue]
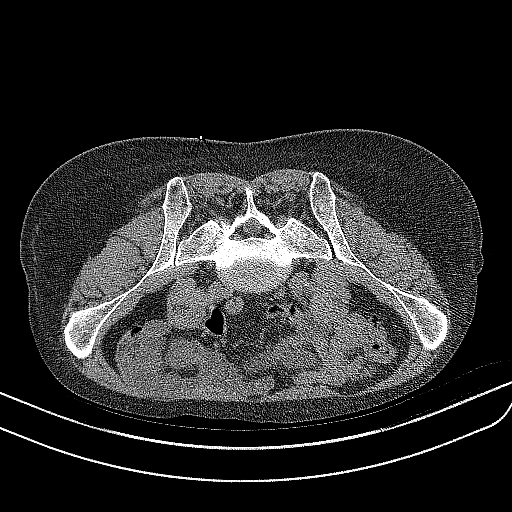
[im 37/46  soft-tissue]
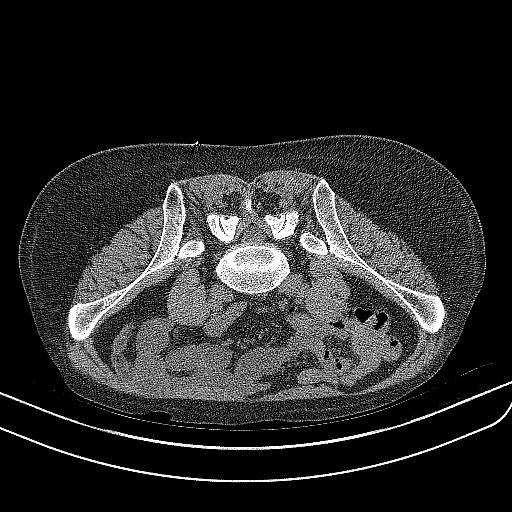
[im 37/46  lung]
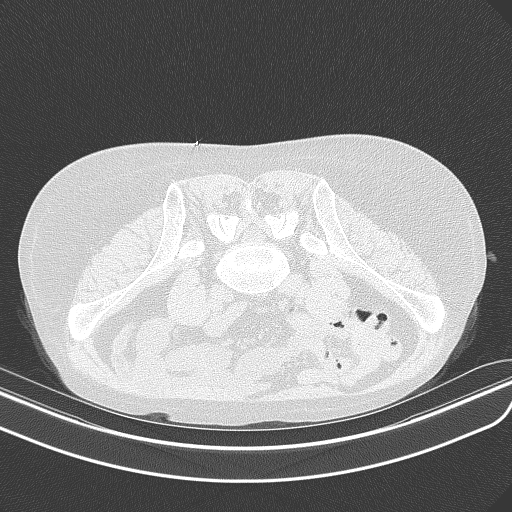
[im 39/46  soft-tissue]
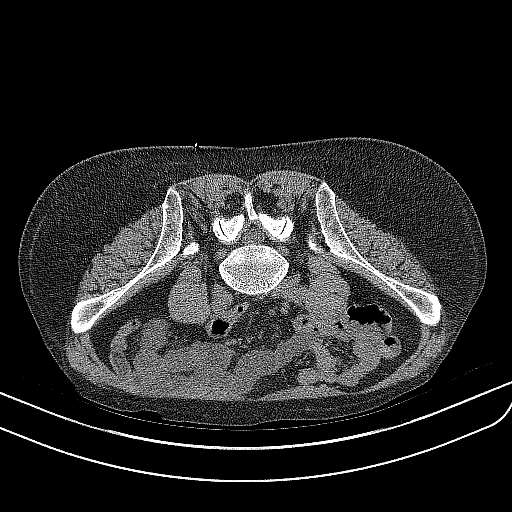
[im 39/46  lung]
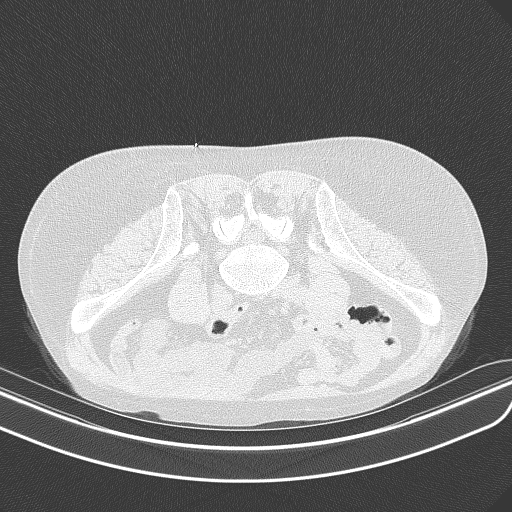
[im 41/46  lung]
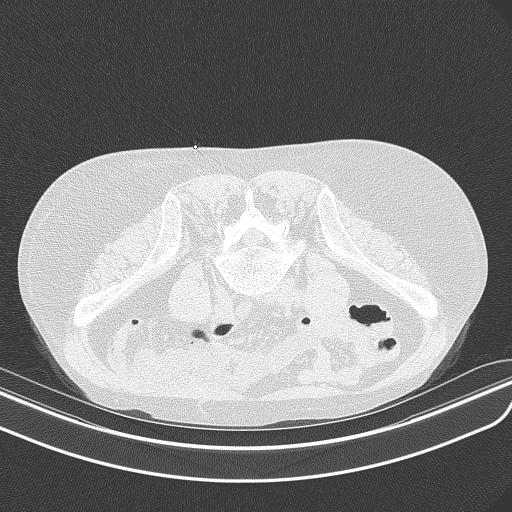
[im 43/46  soft-tissue]
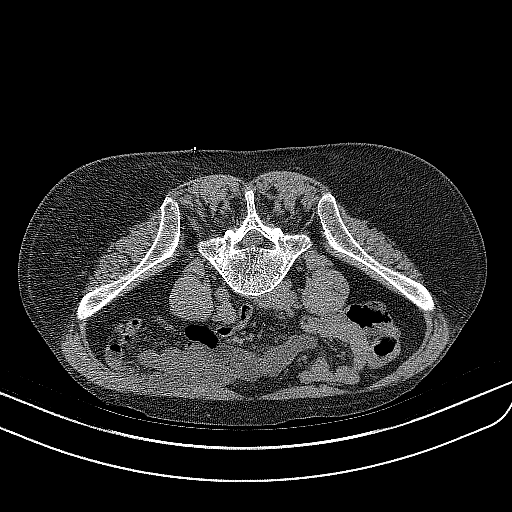
[im 43/46  lung]
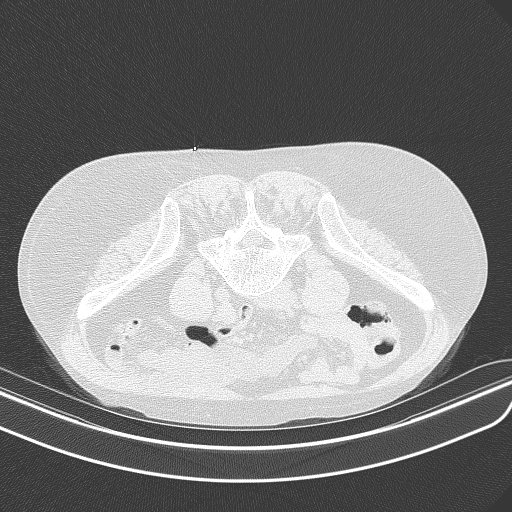

[14 of 32 positions shown; findings below may reference images not displayed]

EXAM:
Right CT GUIDED SI JOINT INJECTION

RADIATION DOSE REDUCTION: This exam was performed according to the
departmental dose-optimization program which includes automated
exposure control, adjustment of the mA and/or kV according to
patient size and/or use of iterative reconstruction technique.



After local anesthesia with 1% lidocaine without epinephrine and
subsequent deep anesthesia, a spinal needle was advanced into the
right SI joint under intermittent CT guidance.

Once the needle was in satisfactory position, representative image
was captured with the needle demonstrated in the sacroiliac joint.
Subsequently, 2 mL 0.25% bupivacaine was injected into the right SI
joint. Needles removed and a sterile dressing applied.

The patient noted immediate relief of the pain was able to ambulate
without pain after the injection.

No complications were observed.
IMPRESSION: Successful CT-guided anesthetic only right SI joint injection.

## 2024-09-16 ENCOUNTER — Other Ambulatory Visit: Payer: Self-pay | Admitting: "Endocrinology

## 2024-09-16 DIAGNOSIS — Z1231 Encounter for screening mammogram for malignant neoplasm of breast: Secondary | ICD-10-CM

## 2024-10-07 ENCOUNTER — Ambulatory Visit

## 2024-10-22 ENCOUNTER — Ambulatory Visit
Admission: RE | Admit: 2024-10-22 | Discharge: 2024-10-22 | Disposition: A | Discharge status: Home / Self Care | Source: Ambulatory Visit | Attending: "Endocrinology | Admitting: "Endocrinology

## 2024-10-22 DIAGNOSIS — Z1231 Encounter for screening mammogram for malignant neoplasm of breast: Secondary | ICD-10-CM

## 2024-11-12 ENCOUNTER — Ambulatory Visit
Admission: EM | Admit: 2024-11-12 | Discharge: 2024-11-12 | Disposition: A | Discharge status: Home / Self Care | Attending: Family Medicine | Admitting: Family Medicine

## 2024-11-12 DIAGNOSIS — R030 Elevated blood-pressure reading, without diagnosis of hypertension: Secondary | ICD-10-CM

## 2024-11-12 DIAGNOSIS — J309 Allergic rhinitis, unspecified: Secondary | ICD-10-CM | POA: Diagnosis not present

## 2024-11-12 MED ORDER — CETIRIZINE HCL 10 MG PO TABS: 10.0000 mg | ORAL_TABLET | Freq: Every day | ORAL | 2 refills | Status: AC

## 2024-11-12 MED ORDER — AZELASTINE HCL 0.1 % NA SOLN: 1.0000 | Freq: Two times a day (BID) | NASAL | 2 refills | Status: AC

## 2024-11-12 NOTE — Discharge Instructions (Signed)
 In addition to the prescribed medications consistently, you may use saline sinus rinses several times daily as needed, Flonase nasal spray twice daily as needed, Coricidin HBP, plain Mucinex.  Follow-up for worsening or unresolving symptoms.

## 2024-11-12 NOTE — ED Triage Notes (Addendum)
 Pt reports sinus congestion x 3  Days sore throat difficulty breathing, cough, pt has declined viral testing during triage

## 2024-11-12 NOTE — ED Provider Notes (Signed)
 " [facility]    CSN: [id number] Arrival date & time: 11/12/24  1136      History   Chief Complaint No chief complaint on file.   HPI Laurie Larson is a 64 y.o. female.   Patient presenting today with 3-day history of nasal congestion, sinus pressure, sore throat.  Denies cough, chest pain, shortness of breath, abdominal pain, vomiting, diarrhea, fevers.  So far trying Aleve with minimal relief.  History of seasonal allergies not currently on anything for this.  Declines viral testing.   Past Medical History:  Diagnosis Date   Benign tumor of breast    removed   Depressive episode    rersolved   Diabetes mellitus without complication (HCC)    Diarrhea    GI Outlaw   GERD (gastroesophageal reflux disease) 04/27/2021   History of stomach ulcers    Hypertension    Rectal bleeding    Symptomatic anemia 01/22/2021    Patient Active Problem List   Diagnosis Date Noted   GERD (gastroesophageal reflux disease) 04/27/2021   Witnessed seizure-like activity (HCC) 04/26/2021   Duodenal ulcer    GI bleed 01/22/2021   Syncope and collapse 01/22/2021   Nausea & vomiting 01/22/2021   Diarrhea 01/22/2021   Symptomatic anemia 01/22/2021   Macrocytic anemia 01/22/2021   Hyperglycemia due to diabetes mellitus (HCC) 01/22/2021   COVID-19 virus infection 01/22/2021   Hypoalbuminemia 01/22/2021   Orthostatic hypotension 01/22/2021   Dehydration 01/22/2021   Essential hypertension 12/02/2017   Hypokalemia 12/02/2017   Tobacco abuse counseling 12/02/2017   DKA, type 2 (HCC) 12/02/2017   DKA (diabetic ketoacidosis) (HCC) 12/01/2017   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    Past Surgical History:  Procedure Laterality Date   ABDOMINAL HYSTERECTOMY     Total   AUGMENTATION MAMMAPLASTY     BREAST ENHANCEMENT SURGERY     BREAST EXCISIONAL BIOPSY     BREAST SURGERY     benign right breast   COLONOSCOPY  12/2012   polyps benign, diverticulosis, hemorrhoids, recheck in  3 years   ESOPHAGOGASTRODUODENOSCOPY     normal esophagus,gastritis, duodenal erosions without bleeding, bx benign   ESOPHAGOGASTRODUODENOSCOPY (EGD) WITH PROPOFOL  N/A 01/22/2021   Procedure: ESOPHAGOGASTRODUODENOSCOPY (EGD) WITH PROPOFOL ;  Surgeon: Eartha Angelia Sieving, MD;  Location: [facility];  Service: Gastroenterology;  Laterality: N/A;    OB History   No obstetric history on file.      Home Medications    Prior to Admission medications  Medication Sig Start Date End Date Taking? Authorizing Provider  azelastine (ASTELIN) 0.1 % nasal spray Place 1 spray into both nostrils 2 (two) times daily. Use in each nostril as directed 11/12/24  Yes Stuart Vernell Norris, PA-C  cetirizine (ZYRTEC ALLERGY) 10 MG tablet Take 1 tablet (10 mg total) by mouth daily. 11/12/24  Yes Stuart Vernell Norris, PA-C  amLODipine  (NORVASC ) 2.5 MG tablet Take 2.5 mg by mouth daily. 10/18/20   [provider]  diazepam  (VALIUM ) 5 MG tablet Take 1 tablet (5 mg total) by mouth every 8 (eight) hours as needed (seizures). 04/28/21   Maree, Pratik D, DO  divalproex  (DEPAKOTE ) 250 MG DR tablet Take 1 tablet (250 mg total) by mouth every 8 (eight) hours. 04/28/21 05/28/21  Maree, Pratik D, DO  losartan  (COZAAR ) 100 MG tablet Take 1 tablet (100 mg total) by mouth daily. 12/02/17 04/26/21  Dhungel, Nishant, MD  Multiple Vitamin (MULTIVITAMIN WITH MINERALS) TABS tablet Take 1 tablet by mouth daily.    [provider]  NOVOLOG  100 UNIT/ML injection Inject into the skin See admin instructions. Uses insuliin via pump. 11/21/20   [provider]  omeprazole  (PRILOSEC) 40 MG capsule Take 1 capsule (40 mg total) by mouth in the morning and at bedtime. 01/25/21 04/25/21  Vicci Afton CROME, MD    Family History Family History  Problem Relation Age of Onset   Ovarian cancer Maternal Aunt        deceased   Juvenile Diabetes Maternal Aunt    Breast cancer Maternal Aunt    Cancer Maternal Grandmother         type unknown   Breast cancer Sister    Breast cancer Mother        deceased    Social History Social History[1]   Allergies   Acetaminophen and Lisinopril   Review of Systems Review of Systems Per HPI  Physical Exam Triage Vital Signs ED Triage Vitals  Encounter Vitals Group     BP 11/12/24 1139 (!) 160/99     Girls Systolic BP Percentile --      Girls Diastolic BP Percentile --      Boys Systolic BP Percentile --      Boys Diastolic BP Percentile --      Pulse Rate 11/12/24 1139 74     Resp 11/12/24 1139 20     Temp 11/12/24 1139 98.4 F (36.9 C)     Temp Source 11/12/24 1139 Oral     SpO2 11/12/24 1139 97 %     Weight --      Height --      Head Circumference --      Peak Flow --      Pain Score 11/12/24 1142 0     Pain Loc --      Pain Education --      Exclude from Growth Chart --    No data found.  Updated Vital Signs BP (!) 160/99 (BP Location: Right Arm)   Pulse 74   Temp 98.4 F (36.9 C) (Oral)   Resp 20   SpO2 97%   Visual Acuity Right Eye Distance:   Left Eye Distance:   Bilateral Distance:    Right Eye Near:   Left Eye Near:    Bilateral Near:     Physical Exam Vitals and nursing note reviewed.  Constitutional:      Appearance: Normal appearance.  HENT:     Head: Atraumatic.     Right Ear: Tympanic membrane and external ear normal.     Left Ear: Tympanic membrane and external ear normal.     Nose: Rhinorrhea present.     Mouth/Throat:     Mouth: Mucous membranes are moist.     Pharynx: Posterior oropharyngeal erythema present.  Eyes:     Extraocular Movements: Extraocular movements intact.     Conjunctiva/sclera: Conjunctivae normal.  Cardiovascular:     Rate and Rhythm: Normal rate and regular rhythm.     Heart sounds: Normal heart sounds.  Pulmonary:     Effort: Pulmonary effort is normal.     Breath sounds: Normal breath sounds. No wheezing or rales.  Musculoskeletal:        General: Normal range of motion.      Cervical back: Normal range of motion and neck supple.  Skin:    General: Skin is warm and dry.  Neurological:     Mental Status: She is alert and oriented to person, place, and time.  Psychiatric:  Mood and Affect: Mood normal.        Thought Content: Thought content normal.      UC Treatments / Results  Labs (all labs ordered are listed, but only abnormal results are displayed) Labs Reviewed - No data to display  EKG   Radiology No results found.  Procedures Procedures (including critical care time)  Medications Ordered in UC Medications - No data to display  Initial Impression / Assessment and Plan / UC Course  I have reviewed the triage vital signs and the nursing notes.  Pertinent labs & imaging results that were available during my care of the patient were reviewed by me and considered in my medical decision making (see chart for details).     Mildly hypertensive in triage, otherwise vital signs reassuring.  Suspect allergic sinusitis.  Treat with Zyrtec, Astelin, supportive over-the-counter medications and home care that will not further elevate blood pressure.  Return for worsening or unresolving symptoms.  Final Clinical Impressions(s) / UC Diagnoses   Final diagnoses:  Allergic sinusitis  Elevated blood pressure reading     Discharge Instructions      In addition to the prescribed medications consistently, you may use saline sinus rinses several times daily as needed, Flonase nasal spray twice daily as needed, Coricidin HBP, plain Mucinex.  Follow-up for worsening or unresolving symptoms.    ED Prescriptions     Medication Sig Dispense Auth. Provider   cetirizine (ZYRTEC ALLERGY) 10 MG tablet Take 1 tablet (10 mg total) by mouth daily. 30 tablet Stuart Vernell Norris, PA-C   azelastine (ASTELIN) 0.1 % nasal spray Place 1 spray into both nostrils 2 (two) times daily. Use in each nostril as directed 30 mL Stuart Vernell Norris, PA-C       PDMP not reviewed this encounter.    [1]  Social History Tobacco Use   Smoking status: Former    Current packs/day: 0.00    Average packs/day: 0.5 packs/day for 15.0 years (7.5 ttl pk-yrs)    Types: Cigarettes    Start date: 08/02/2005    Quit date: 08/02/2020    Years since quitting: 4.2   Smokeless tobacco: Never  Vaping Use   Vaping status: Never Used  Substance Use Topics   Alcohol use: Yes    Alcohol/week: 5.0 standard drinks of alcohol    Types: 5 Glasses of wine per week    Comment: moderate   Drug use: No     Stuart Vernell Norris, PA-C 11/12/24 1336  "

## 2024-12-18 ENCOUNTER — Other Ambulatory Visit (HOSPITAL_COMMUNITY): Payer: Self-pay | Admitting: Physician Assistant

## 2024-12-18 DIAGNOSIS — N951 Menopausal and female climacteric states: Secondary | ICD-10-CM
